# Patient Record
Sex: Female | Born: 1950 | Race: Black or African American | Hispanic: No
Health system: Southern US, Community
[De-identification: ages and names within clinical notes are randomized; demographics above are authoritative.]

## PROBLEM LIST (undated history)

## (undated) DIAGNOSIS — G931 Anoxic brain damage, not elsewhere classified: Secondary | ICD-10-CM

## (undated) DIAGNOSIS — J9621 Acute and chronic respiratory failure with hypoxia: Secondary | ICD-10-CM

## (undated) DIAGNOSIS — J449 Chronic obstructive pulmonary disease, unspecified: Secondary | ICD-10-CM

## (undated) DIAGNOSIS — I469 Cardiac arrest, cause unspecified: Secondary | ICD-10-CM

## (undated) DIAGNOSIS — J690 Pneumonitis due to inhalation of food and vomit: Secondary | ICD-10-CM

---

## 2018-07-30 ENCOUNTER — Ambulatory Visit (HOSPITAL_COMMUNITY)
Admission: AD | Admit: 2018-07-30 | Discharge: 2018-07-30 | Disposition: A | Discharge status: Home / Self Care | Payer: Medicare Other | Source: Other Acute Inpatient Hospital | Attending: Internal Medicine | Admitting: Internal Medicine

## 2018-07-30 ENCOUNTER — Inpatient Hospital Stay
Admission: RE | Admit: 2018-07-30 | Discharge: 2018-09-18 | Disposition: A | Discharge status: Home / Self Care | Payer: Medicare Other | Source: Ambulatory Visit | Attending: Internal Medicine | Admitting: Internal Medicine

## 2018-07-30 ENCOUNTER — Other Ambulatory Visit (HOSPITAL_COMMUNITY): Payer: Medicare Other

## 2018-07-30 DIAGNOSIS — D72829 Elevated white blood cell count, unspecified: Secondary | ICD-10-CM

## 2018-07-30 DIAGNOSIS — G931 Anoxic brain damage, not elsewhere classified: Secondary | ICD-10-CM | POA: Diagnosis present

## 2018-07-30 DIAGNOSIS — J690 Pneumonitis due to inhalation of food and vomit: Secondary | ICD-10-CM | POA: Diagnosis present

## 2018-07-30 DIAGNOSIS — Z931 Gastrostomy status: Secondary | ICD-10-CM

## 2018-07-30 DIAGNOSIS — J80 Acute respiratory distress syndrome: Secondary | ICD-10-CM

## 2018-07-30 DIAGNOSIS — R0603 Acute respiratory distress: Secondary | ICD-10-CM

## 2018-07-30 DIAGNOSIS — T85598A Other mechanical complication of other gastrointestinal prosthetic devices, implants and grafts, initial encounter: Secondary | ICD-10-CM

## 2018-07-30 DIAGNOSIS — T17908A Unspecified foreign body in respiratory tract, part unspecified causing other injury, initial encounter: Secondary | ICD-10-CM

## 2018-07-30 DIAGNOSIS — R609 Edema, unspecified: Secondary | ICD-10-CM

## 2018-07-30 DIAGNOSIS — Z4659 Encounter for fitting and adjustment of other gastrointestinal appliance and device: Secondary | ICD-10-CM

## 2018-07-30 DIAGNOSIS — J9690 Respiratory failure, unspecified, unspecified whether with hypoxia or hypercapnia: Secondary | ICD-10-CM | POA: Insufficient documentation

## 2018-07-30 DIAGNOSIS — J9621 Acute and chronic respiratory failure with hypoxia: Secondary | ICD-10-CM | POA: Diagnosis not present

## 2018-07-30 DIAGNOSIS — I469 Cardiac arrest, cause unspecified: Secondary | ICD-10-CM | POA: Diagnosis present

## 2018-07-30 DIAGNOSIS — J449 Chronic obstructive pulmonary disease, unspecified: Secondary | ICD-10-CM | POA: Diagnosis present

## 2018-07-30 HISTORY — DX: Pneumonitis due to inhalation of food and vomit: J69.0

## 2018-07-30 HISTORY — DX: Acute and chronic respiratory failure with hypoxia: J96.21

## 2018-07-30 HISTORY — DX: Anoxic brain damage, not elsewhere classified: G93.1

## 2018-07-30 HISTORY — DX: Cardiac arrest, cause unspecified: I46.9

## 2018-07-30 HISTORY — DX: Chronic obstructive pulmonary disease, unspecified: J44.9

## 2018-07-30 LAB — BLOOD GAS, ARTERIAL
Acid-base deficit: 0.8 mmol/L (ref 0.0–2.0)
Acid-base deficit: 1.8 mmol/L (ref 0.0–2.0)
Bicarbonate: 23.1 mmol/L (ref 20.0–28.0)
Bicarbonate: 23.7 mmol/L (ref 20.0–28.0)
Drawn by: 2308
FIO2: 40
FIO2: 40
MECHVT: 340 mL
MECHVT: 340 mL
O2 Saturation: 97.9 %
O2 Saturation: 82.6 %
PEEP: 5 cmH2O
PEEP: 5 cmH2O
Patient temperature: 98.6
Patient temperature: 98.6
RATE: 14 resp/min
RATE: 14 resp/min
pCO2 arterial: 36.6 mmHg (ref 32.0–48.0)
pCO2 arterial: 48.9 mmHg — ABNORMAL HIGH (ref 32.0–48.0)
pH, Arterial: 7.416 (ref 7.350–7.450)
pH, Arterial: 7.306 — ABNORMAL LOW (ref 7.350–7.450)
pO2, Arterial: 112 mmHg — ABNORMAL HIGH (ref 83.0–108.0)
pO2, Arterial: 55 mmHg — ABNORMAL LOW (ref 83.0–108.0)

## 2018-07-30 LAB — BASIC METABOLIC PANEL
Anion gap: 10 (ref 5–15)
BUN: 74 mg/dL — ABNORMAL HIGH (ref 8–23)
CO2: 21 mmol/L — ABNORMAL LOW (ref 22–32)
Calcium: 9.3 mg/dL (ref 8.9–10.3)
Chloride: 115 mmol/L — ABNORMAL HIGH (ref 98–111)
Creatinine, Ser: 1.71 mg/dL — ABNORMAL HIGH (ref 0.44–1.00)
GFR calc Af Amer: 36 mL/min — ABNORMAL LOW (ref >60)
GFR calc non Af Amer: 31 mL/min — ABNORMAL LOW (ref >60)
Glucose, Bld: 106 mg/dL — ABNORMAL HIGH (ref 70–99)
Potassium: 4.8 mmol/L (ref 3.5–5.1)
Sodium: 146 mmol/L — ABNORMAL HIGH (ref 135–145)

## 2018-07-30 LAB — URINALYSIS, ROUTINE W REFLEX MICROSCOPIC
Bilirubin Urine: NEGATIVE
Glucose, UA: NEGATIVE mg/dL
Ketones, ur: NEGATIVE mg/dL
Leukocytes,Ua: NEGATIVE
Nitrite: NEGATIVE
Protein, ur: 30 mg/dL — AB
Specific Gravity, Urine: 1.015 (ref 1.005–1.030)
pH: 5 (ref 5.0–8.0)

## 2018-07-30 LAB — CBC
HCT: 25.8 % — ABNORMAL LOW (ref 36.0–46.0)
Hemoglobin: 9.6 g/dL — ABNORMAL LOW (ref 12.0–15.0)
MCH: 31.1 pg (ref 26.0–34.0)
MCHC: 37.2 g/dL — ABNORMAL HIGH (ref 30.0–36.0)
MCV: 83.5 fL (ref 80.0–100.0)
Platelets: 152 10*3/uL (ref 150–400)
RBC: 3.09 MIL/uL — ABNORMAL LOW (ref 3.87–5.11)
RDW: 14.2 % (ref 11.5–15.5)
WBC: 11.1 10*3/uL — ABNORMAL HIGH (ref 4.0–10.5)
nRBC: 5.2 % — ABNORMAL HIGH (ref 0.0–0.2)

## 2018-07-30 LAB — C DIFFICILE QUICK SCREEN W PCR REFLEX
C Diff antigen: NEGATIVE
C Diff interpretation: NOT DETECTED
C Diff toxin: NEGATIVE

## 2018-07-30 MED ORDER — DAMOR DRESSING EX PADS
40.00 | MEDICATED_PAD | CUTANEOUS | Status: DC
Start: 2018-07-30 — End: 2018-07-30

## 2018-07-30 MED ORDER — SODIUM CHLORIDE 7 % IN NEBU
4.00 | INHALATION_SOLUTION | RESPIRATORY_TRACT | Status: DC
Start: 2018-07-30 — End: 2018-07-30

## 2018-07-30 MED ORDER — GENERIC EXTERNAL MEDICATION
Status: DC
Start: ? — End: 2018-07-30

## 2018-07-30 MED ORDER — ACETAMINOPHEN 160 MG/5ML PO LIQD
650.00 | ORAL | Status: DC
Start: ? — End: 2018-07-30

## 2018-07-30 MED ORDER — Medication
Status: DC
Start: ? — End: 2018-07-30

## 2018-07-30 MED ORDER — ACETAMINOPHEN 650 MG RE SUPP
650.00 | RECTAL | Status: DC
Start: ? — End: 2018-07-30

## 2018-07-30 MED ORDER — TUSSIDEX 10-30-200 MG/5ML PO LIQD
20.00 | ORAL | Status: DC
Start: 2018-07-30 — End: 2018-07-30

## 2018-07-30 MED ORDER — CARVEDILOL 12.5 MG PO TABS
12.50 | ORAL_TABLET | ORAL | Status: DC
Start: 2018-07-30 — End: 2018-07-30

## 2018-07-30 MED ORDER — OSCO ANTI-NAUSEA 1.87-1.87-21.5 OR SOLN
25.00 | ORAL | Status: DC
Start: 2018-07-30 — End: 2018-07-30

## 2018-07-30 MED ORDER — ONE-A-DAY WITHIN PO
30.00 | ORAL | Status: DC
Start: ? — End: 2018-07-30

## 2018-07-30 MED ORDER — GENERIC EXTERNAL MEDICATION
2.00 | Status: DC
Start: 2018-07-30 — End: 2018-07-30

## 2018-07-30 MED ORDER — ASPIRIN 81 MG PO CHEW
81.00 | CHEWABLE_TABLET | ORAL | Status: DC
Start: 2018-07-30 — End: 2018-07-30

## 2018-07-30 MED ORDER — ENOXAPARIN SODIUM 30 MG/0.3ML ~~LOC~~ SOLN
30.00 | SUBCUTANEOUS | Status: DC
Start: 2018-07-30 — End: 2018-07-30

## 2018-07-30 MED ORDER — GENERIC EXTERNAL MEDICATION
500.00 | Status: DC
Start: 2018-07-30 — End: 2018-07-30

## 2018-07-30 MED ORDER — Medication
0.08 | Status: DC
Start: ? — End: 2018-07-30

## 2018-07-30 MED ORDER — OXYCODONE HCL 5 MG PO TABS
10.00 | ORAL_TABLET | ORAL | Status: DC
Start: ? — End: 2018-07-30

## 2018-07-30 MED ORDER — ALBUTEROL SULFATE (2.5 MG/3ML) 0.083% IN NEBU
2.50 | INHALATION_SOLUTION | RESPIRATORY_TRACT | Status: DC
Start: 2018-07-30 — End: 2018-07-30

## 2018-07-30 MED ORDER — ACETAMINOPHEN 325 MG PO TABS
650.00 | ORAL_TABLET | ORAL | Status: DC
Start: ? — End: 2018-07-30

## 2018-07-30 MED ORDER — VICON FORTE PO CAPS
17.00 | ORAL_CAPSULE | ORAL | Status: DC
Start: ? — End: 2018-07-30

## 2018-07-30 MED ORDER — CHLOROPHYLL EX
20.00 | CUTANEOUS | Status: DC
Start: ? — End: 2018-07-30

## 2018-07-30 MED ORDER — CHLOROBUTANOL-EUGENOL & APAP
0.40 | Status: DC
Start: ? — End: 2018-07-30

## 2018-07-30 MED ORDER — CVS KIDPANT BOYS X-LARGE MISC
40.00 | Status: DC
Start: 2018-07-30 — End: 2018-07-30

## 2018-07-30 MED ORDER — DIAZEPAM 5 MG PO TABS
5.00 | ORAL_TABLET | ORAL | Status: DC
Start: ? — End: 2018-07-30

## 2018-07-30 MED ORDER — PHENYLEPHRINE-GUAIFENESIN 30-400 MG PO CP12
5.00 | ORAL_CAPSULE | ORAL | Status: DC
Start: 2018-07-30 — End: 2018-07-30

## 2018-07-30 MED ORDER — GENERIC EXTERNAL MEDICATION
.04 | Status: DC
Start: ? — End: 2018-07-30

## 2018-07-30 NOTE — Consult Note (Signed)
Pulmonary Critical Care Medicine [facility] [place]  PULMONARY SERVICE  Date of Service: 07/30/2018  PULMONARY CRITICAL CARE CONSULT   Catherine Pacheco [place]  LHT:[id number]  DOB: 16-Dec-1950   DOA: 07/30/2018  Referring Physician: Carron Curie, MD  HPI: Catherine Pacheco is a 69 y.o. female seen for follow up of Acute on Chronic Respiratory Failure.  Patient has multiple medical problems including COPD coronary artery disease chronic kidney disease CHF rheumatoid arthritis who was found by her daughter passed out.  Patient had apparently been having some shortness of breath prior.  She does have a history of COPD and positive history of tobacco use.  When found by EMS she was found to be asystolic.  Patient did get CPR about 10 minutes total and she had return of circulation.  Patient ultimately ended up being intubated chest x-ray had shown pulmonary edema.  There was also some concern about pneumonia.  It is likely that she may have aspirated.  Patient was started on antibiotics and she was noted that her temperature was 33C and so she was actually rewarmed.  EEG had shown diffuse encephalopathy and anoxic brain injury and there has been no improvement during her acute stay.  Ultimately she ended up having to have a tracheostomy for anticipated prolonged mechanical ventilation.  Review of Systems:  ROS performed and is unremarkable other than noted above.  Past Medical History:  Diagnosis Date  ?Marland Kitchen Anemia  ?Marland Kitchen Arthritis  RA - left hip  ?Marland Kitchen Coronary artery disease  [id number]  ?Marland Kitchen Hepatitis B 2013 oct  ?Marland Kitchen Hyperlipidemia  ?Marland Kitchen Hypertension  ?Marland Kitchen Ischemic cardiomyopathy 06/14/2016  ?. Kidney disease  stage 3 kidney disease  ?Marland Kitchen Lupus (*)  ?. Myocardial infarction (*)  ?Marland Kitchen Rheumatoid arthritis (*)  ?. Stented coronary artery 2013   Past Surgical History:  Procedure Laterality Date  ?Marland Kitchen Appendectomy  ?. Cardiac surgery  ?Marland Kitchen Tubal ligation   Allergies  Allergen Reactions  ?Marland Kitchen  Albuterol Hypertension  ?Marland Kitchen Indomethacin Other  Gi upset  ?Marland Kitchen Penicillins Unknown  ?Marland Kitchen Simvastatin Rash  ?. Meclizine Diarrhea    Family History: Non-Contributory to the present illness   Medications: Reviewed on Rounds  Physical Exam:  Vitals: Temperature 97.0 pulse 69 respiratory rate 31 blood pressure 177/84 saturations 98%  Ventilator Settings orally intubated mode ventilation assist control FiO2 50% tidal volume 364 PEEP 5  . General: Comfortable at this time . Eyes: Grossly normal lids, irises & conjunctiva . ENT: grossly tongue is normal . Neck: no obvious mass . Cardiovascular: S1-S2 normal no gallop or rub is noted . Respiratory: No rhonchi no rales . Abdomen: Soft nontender . Skin: no rash seen on limited exam . Musculoskeletal: not rigid . Psychiatric:unable to assess . Neurologic: no seizure no involuntary movements         Labs on Admission:  Basic Metabolic Panel: Recent Labs  Lab 07/30/18 0502  NA 146*  K 4.8  CL 115*  CO2 21*  GLUCOSE 106*  BUN 74*  CREATININE 1.71*  CALCIUM 9.3    Recent Labs  Lab 07/30/18 0505  PHART 7.416  PCO2ART 36.6  PO2ART 112*  HCO3 23.1  O2SAT 97.9    Liver Function Tests: No results for input(s): AST, ALT, ALKPHOS, BILITOT, PROT, ALBUMIN in the last 168 hours. No results for input(s): LIPASE, AMYLASE in the last 168 hours. No results for input(s): AMMONIA in the last 168 hours.  CBC: Recent Labs  Lab 07/30/18 0502  WBC 11.1*  HGB  9.6*  HCT 25.8*  MCV 83.5  PLT 152    Cardiac Enzymes: No results for input(s): CKTOTAL, CKMB, CKMBINDEX, TROPONINI in the last 168 hours.  BNP (last 3 results) No results for input(s): BNP in the last 8760 hours.  ProBNP (last 3 results) No results for input(s): PROBNP in the last 8760 hours.   Radiological Exams on Admission: Dg Chest Port 1 View  Result Date: 07/30/2018 CLINICAL DATA:  Check nasogastric catheter and endotracheal tube placement EXAM: PORTABLE  CHEST 1 VIEW COMPARISON:  None. FINDINGS: Cardiac shadow is mildly enlarged. Endotracheal tube and gastric catheter are noted in satisfactory position. Patchy infiltrate in the right base is noted. No bony abnormality is seen. IMPRESSION: Right basilar infiltrate. Tubes and lines as described. Electronically Signed   By: Alcide CleverMark  Lukens M.D.   On: 07/30/2018 02:51   Dg Abd Portable 1v  Result Date: 07/30/2018 CLINICAL DATA:  Check gastric catheter placement EXAM: PORTABLE ABDOMEN - 1 VIEW COMPARISON:  None. FINDINGS: Gastric catheter is noted within the stomach. No obstructive changes are seen. No bony abnormality is noted. IMPRESSION: Gastric catheter within the stomach. Electronically Signed   By: Alcide CleverMark  Lukens M.D.   On: 07/30/2018 02:52    Assessment/Plan Active Problems:   Acute on chronic respiratory failure with hypoxia (HCC)   Cardiac arrest (HCC)   COPD with chronic bronchitis (HCC)   Aspiration pneumonia due to gastric secretions (HCC)   Anoxic brain injury (HCC)   1. Acute on chronic respiratory failure with hypoxia she has multiple medical problems including COPD which likely led to her cardiac arrest as well as a history of coronary artery disease and CHF.  Patient right now remains unresponsive she has had a very poor EEG that was done at the other facility.  We will continue with full supportive care she remains endotracheally intubated.  Appears that she may also have some issues with dislodged teeth and we may have to do an oral tracheostomy 2. Cardiac arrest currently the rhythm is stable we will continue with supportive care monitor her closely overall prognosis is quite poor. 3. COPD by history nebulizers as necessary. 4. Right lower lobe pneumonia likely was aspiration patient has been treated at the other facility. 5. Anoxic brain injury pretty significant as deemed by the EEG at the other facility  I have personally seen and evaluated the patient, evaluated laboratory and  imaging results, formulated the assessment and plan and placed orders. The Patient requires high complexity decision making for assessment and support.  Case was discussed on Rounds with the Respiratory Therapy Staff Time Spent 70minutes  Yevonne PaxSaadat A Lizann Edelman, MD [facility] Pulmonary Critical Care Medicine Sleep Medicine

## 2018-07-31 DIAGNOSIS — I469 Cardiac arrest, cause unspecified: Secondary | ICD-10-CM | POA: Diagnosis not present

## 2018-07-31 DIAGNOSIS — J9621 Acute and chronic respiratory failure with hypoxia: Secondary | ICD-10-CM | POA: Diagnosis not present

## 2018-07-31 DIAGNOSIS — G931 Anoxic brain damage, not elsewhere classified: Secondary | ICD-10-CM | POA: Diagnosis not present

## 2018-07-31 DIAGNOSIS — J690 Pneumonitis due to inhalation of food and vomit: Secondary | ICD-10-CM | POA: Diagnosis not present

## 2018-07-31 LAB — MAGNESIUM: Magnesium: 2 mg/dL (ref 1.7–2.4)

## 2018-07-31 LAB — BLOOD GAS, ARTERIAL
Acid-base deficit: 1.9 mmol/L (ref 0.0–2.0)
Bicarbonate: 22.8 mmol/L (ref 20.0–28.0)
FIO2: 50
MECHVT: 340 mL
O2 Saturation: 98.8 %
PEEP: 5 cmH2O
Patient temperature: 98.6
RATE: 20 resp/min
pCO2 arterial: 42.2 mmHg (ref 32.0–48.0)
pH, Arterial: 7.352 (ref 7.350–7.450)
pO2, Arterial: 158 mmHg — ABNORMAL HIGH (ref 83.0–108.0)

## 2018-07-31 LAB — COMPREHENSIVE METABOLIC PANEL
ALT: 40 U/L (ref 0–44)
AST: 83 U/L — ABNORMAL HIGH (ref 15–41)
Albumin: 2.5 g/dL — ABNORMAL LOW (ref 3.5–5.0)
Alkaline Phosphatase: 93 U/L (ref 38–126)
Anion gap: 12 (ref 5–15)
BUN: 77 mg/dL — ABNORMAL HIGH (ref 8–23)
CO2: 21 mmol/L — ABNORMAL LOW (ref 22–32)
Calcium: 9.5 mg/dL (ref 8.9–10.3)
Chloride: 111 mmol/L (ref 98–111)
Creatinine, Ser: 1.64 mg/dL — ABNORMAL HIGH (ref 0.44–1.00)
GFR calc Af Amer: 37 mL/min — ABNORMAL LOW (ref >60)
GFR calc non Af Amer: 32 mL/min — ABNORMAL LOW (ref >60)
Glucose, Bld: 131 mg/dL — ABNORMAL HIGH (ref 70–99)
Potassium: 4.4 mmol/L (ref 3.5–5.1)
Sodium: 144 mmol/L (ref 135–145)
Total Bilirubin: 0.7 mg/dL (ref 0.3–1.2)
Total Protein: 3.2 g/dL — ABNORMAL LOW (ref 6.5–8.1)

## 2018-07-31 LAB — CBC
HCT: 22.4 % — ABNORMAL LOW (ref 36.0–46.0)
Hemoglobin: 8.3 g/dL — ABNORMAL LOW (ref 12.0–15.0)
MCH: 31.6 pg (ref 26.0–34.0)
MCHC: 37.1 g/dL — ABNORMAL HIGH (ref 30.0–36.0)
MCV: 85.2 fL (ref 80.0–100.0)
Platelets: 133 10*3/uL — ABNORMAL LOW (ref 150–400)
RBC: 2.63 MIL/uL — ABNORMAL LOW (ref 3.87–5.11)
RDW: 14.5 % (ref 11.5–15.5)
WBC: 12 10*3/uL — ABNORMAL HIGH (ref 4.0–10.5)
nRBC: 3 % — ABNORMAL HIGH (ref 0.0–0.2)

## 2018-07-31 LAB — HEMOGLOBIN A1C
Hgb A1c MFr Bld: 5.3 % (ref 4.8–5.6)
Mean Plasma Glucose: 105.41 mg/dL

## 2018-07-31 LAB — URINE CULTURE: Culture: NO GROWTH

## 2018-07-31 LAB — T4, FREE: Free T4: 0.67 ng/dL — ABNORMAL LOW (ref 0.82–1.77)

## 2018-07-31 LAB — PHOSPHORUS: Phosphorus: 4.6 mg/dL (ref 2.5–4.6)

## 2018-07-31 LAB — TSH: TSH: 1.494 u[IU]/mL (ref 0.350–4.500)

## 2018-07-31 NOTE — Progress Notes (Addendum)
Pulmonary Critical Care Medicine [facility] [place]   PULMONARY CRITICAL CARE SERVICE  PROGRESS NOTE  Date of Service: 07/31/2018  Catherine Pacheco  VPX:[id number]  DOB: 1950/07/11   DOA: 07/30/2018  Referring Physician: Carron Curie, MD  HPI: Catherine Pacheco is a 68 y.o. female seen for follow up of Acute on Chronic Respiratory Failure.  Patient remains on full support at this time with a rate of 12 current requiring 40% FiO2.  Remains intubated with ET tube 7.5, 23 at the lip.  Medications: Reviewed on Rounds  Physical Exam:  Vitals: Blood 65 respirations 20 BP 169/92 O2 sat 100% temp 99.0  Ventilator Settings ventilated AC VC rate of 12 tidal volume 340 PEEP of 5 FiO2 40%  . General: Comfortable at this time . Eyes: Grossly normal lids, irises & conjunctiva . ENT: grossly tongue is normal . Neck: no obvious mass . Cardiovascular: S1 S2 normal no gallop . Respiratory: No rales or rhonchi noted . Abdomen: soft . Skin: no rash seen on limited exam . Musculoskeletal: not rigid . Psychiatric:unable to assess . Neurologic: no seizure no involuntary movements         Lab Data:   Basic Metabolic Panel: Recent Labs  Lab 07/30/18 0502 07/31/18 0617  NA 146* 144  K 4.8 4.4  CL 115* 111  CO2 21* 21*  GLUCOSE 106* 131*  BUN 74* 77*  CREATININE 1.71* 1.64*  CALCIUM 9.3 9.5  MG  --  2.0  PHOS  --  4.6    ABG: Recent Labs  Lab 07/30/18 0505 07/30/18 2313 07/31/18 0500  PHART 7.416 7.306* 7.352  PCO2ART 36.6 48.9* 42.2  PO2ART 112* 55.0* 158*  HCO3 23.1 23.7 22.8  O2SAT 97.9 82.6 98.8    Liver Function Tests: Recent Labs  Lab 07/31/18 0617  AST 83*  ALT 40  ALKPHOS 93  BILITOT 0.7  PROT 3.2*  ALBUMIN 2.5*   No results for input(s): LIPASE, AMYLASE in the last 168 hours. No results for input(s): AMMONIA in the last 168 hours.  CBC: Recent Labs  Lab 07/30/18 0502 07/31/18 0617  WBC 11.1* 12.0*  HGB 9.6* 8.3*  HCT 25.8*  22.4*  MCV 83.5 85.2  PLT 152 133*    Cardiac Enzymes: No results for input(s): CKTOTAL, CKMB, CKMBINDEX, TROPONINI in the last 168 hours.  BNP (last 3 results) No results for input(s): BNP in the last 8760 hours.  ProBNP (last 3 results) No results for input(s): PROBNP in the last 8760 hours.  Radiological Exams: Dg Chest Port 1 View  Result Date: 07/30/2018 CLINICAL DATA:  Check nasogastric catheter and endotracheal tube placement EXAM: PORTABLE CHEST 1 VIEW COMPARISON:  None. FINDINGS: Cardiac shadow is mildly enlarged. Endotracheal tube and gastric catheter are noted in satisfactory position. Patchy infiltrate in the right base is noted. No bony abnormality is seen. IMPRESSION: Right basilar infiltrate. Tubes and lines as described. Electronically Signed   By: Alcide Clever M.D.   On: 07/30/2018 02:51   Dg Abd Portable 1v  Result Date: 07/30/2018 CLINICAL DATA:  Check gastric catheter placement EXAM: PORTABLE ABDOMEN - 1 VIEW COMPARISON:  None. FINDINGS: Gastric catheter is noted within the stomach. No obstructive changes are seen. No bony abnormality is noted. IMPRESSION: Gastric catheter within the stomach. Electronically Signed   By: Alcide Clever M.D.   On: 07/30/2018 02:52    Assessment/Plan Active Problems:   * No active hospital problems. *   1. Acute on chronic respiratory failure  with hypoxia patient remains intubated and unresponsive at this time.  Continue on ventilator full support as well as supportive measures of pulmonary toilet. 2. Cardiac arrest rhythm is stable prognosis is guarded 3. COPD per history, continue medications necessary. 4. Right lower lobe pneumonia likely from aspiration treated at previous facility team to monitor 5. Anoxic brain injury, significant per EEG at previous facility.   I have personally seen and evaluated the patient, evaluated laboratory and imaging results, formulated the assessment and plan and placed orders. The Patient requires  high complexity decision making for assessment and support.  Case was discussed on Rounds with the Respiratory Therapy Staff  Yevonne PaxSaadat A Cayla Wiegand, MD [facility] Pulmonary Critical Care Medicine Sleep Medicine

## 2018-08-01 DIAGNOSIS — J9621 Acute and chronic respiratory failure with hypoxia: Secondary | ICD-10-CM | POA: Diagnosis not present

## 2018-08-01 DIAGNOSIS — J690 Pneumonitis due to inhalation of food and vomit: Secondary | ICD-10-CM | POA: Diagnosis not present

## 2018-08-01 DIAGNOSIS — G931 Anoxic brain damage, not elsewhere classified: Secondary | ICD-10-CM | POA: Diagnosis not present

## 2018-08-01 DIAGNOSIS — I469 Cardiac arrest, cause unspecified: Secondary | ICD-10-CM | POA: Diagnosis not present

## 2018-08-02 ENCOUNTER — Encounter: Payer: Self-pay | Admitting: Internal Medicine

## 2018-08-02 DIAGNOSIS — J690 Pneumonitis due to inhalation of food and vomit: Secondary | ICD-10-CM | POA: Diagnosis not present

## 2018-08-02 DIAGNOSIS — G931 Anoxic brain damage, not elsewhere classified: Secondary | ICD-10-CM | POA: Diagnosis present

## 2018-08-02 DIAGNOSIS — I469 Cardiac arrest, cause unspecified: Secondary | ICD-10-CM | POA: Diagnosis not present

## 2018-08-02 DIAGNOSIS — J9621 Acute and chronic respiratory failure with hypoxia: Secondary | ICD-10-CM | POA: Diagnosis present

## 2018-08-02 DIAGNOSIS — J449 Chronic obstructive pulmonary disease, unspecified: Secondary | ICD-10-CM | POA: Diagnosis present

## 2018-08-02 LAB — CULTURE, RESPIRATORY W GRAM STAIN: Culture: NORMAL

## 2018-08-02 NOTE — Progress Notes (Signed)
Pulmonary Critical Care Medicine [facility] [place]   PULMONARY CRITICAL CARE SERVICE  PROGRESS NOTE  Date of Service: 08/02/2018  Catherine Pacheco  RCV:[id number]  DOB: March 23, 1950   DOA: 07/30/2018  Referring Physician: Carron Curie, MD  HPI: Catherine Pacheco is a 68 y.o. female seen for follow up of Acute on Chronic Respiratory Failure.  At this time patient is on the ventilator full support remains orally intubated.  Patient is on 28% FiO2  Medications: Reviewed on Rounds  Physical Exam:  Vitals: Temperature 98.1 pulse 80 respiratory rate 35 blood pressure 147/82 saturations 99%  Ventilator Settings mode ventilation assist control FiO2 20% tidal volume 431 PEEP 5  . General: Comfortable at this time . Eyes: Grossly normal lids, irises & conjunctiva . ENT: grossly tongue is normal . Neck: no obvious mass . Cardiovascular: S1 S2 normal no gallop . Respiratory: No rhonchi or rales are noted at this time . Abdomen: soft . Skin: no rash seen on limited exam . Musculoskeletal: not rigid . Psychiatric:unable to assess . Neurologic: no seizure no involuntary movements         Lab Data:   Basic Metabolic Panel: Recent Labs  Lab 07/30/18 0502 07/31/18 0617  NA 146* 144  K 4.8 4.4  CL 115* 111  CO2 21* 21*  GLUCOSE 106* 131*  BUN 74* 77*  CREATININE 1.71* 1.64*  CALCIUM 9.3 9.5  MG  --  2.0  PHOS  --  4.6    ABG: Recent Labs  Lab 07/30/18 0505 07/30/18 2313 07/31/18 0500  PHART 7.416 7.306* 7.352  PCO2ART 36.6 48.9* 42.2  PO2ART 112* 55.0* 158*  HCO3 23.1 23.7 22.8  O2SAT 97.9 82.6 98.8    Liver Function Tests: Recent Labs  Lab 07/31/18 0617  AST 83*  ALT 40  ALKPHOS 93  BILITOT 0.7  PROT 3.2*  ALBUMIN 2.5*   No results for input(s): LIPASE, AMYLASE in the last 168 hours. No results for input(s): AMMONIA in the last 168 hours.  CBC: Recent Labs  Lab 07/30/18 0502 07/31/18 0617  WBC 11.1* 12.0*  HGB 9.6* 8.3*  HCT  25.8* 22.4*  MCV 83.5 85.2  PLT 152 133*    Cardiac Enzymes: No results for input(s): CKTOTAL, CKMB, CKMBINDEX, TROPONINI in the last 168 hours.  BNP (last 3 results) No results for input(s): BNP in the last 8760 hours.  ProBNP (last 3 results) No results for input(s): PROBNP in the last 8760 hours.  Radiological Exams: No results found.  Assessment/Plan Active Problems:   Acute on chronic respiratory failure with hypoxia (HCC)   Cardiac arrest (HCC)   COPD with chronic bronchitis (HCC)   Aspiration pneumonia due to gastric secretions (HCC)   Anoxic brain injury (HCC)   1. Acute on chronic respiratory failure with hypoxia continue with full support on the ventilator RSB I mechanics have been poor not able to wean 2. Cardiac arrest currently rhythm has been stable we will continue to monitor 3. COPD at baseline continue present management 4. Aspiration pneumonia treated follow radiologically 5. Anoxic brain injury prognosis quite poor   I have personally seen and evaluated the patient, evaluated laboratory and imaging results, formulated the assessment and plan and placed orders. The Patient requires high complexity decision making for assessment and support.  Case was discussed on Rounds with the Respiratory Therapy Staff  Yevonne Pax, MD [facility] Pulmonary Critical Care Medicine Sleep Medicine

## 2018-08-03 DIAGNOSIS — J9621 Acute and chronic respiratory failure with hypoxia: Secondary | ICD-10-CM | POA: Diagnosis not present

## 2018-08-03 DIAGNOSIS — G931 Anoxic brain damage, not elsewhere classified: Secondary | ICD-10-CM | POA: Diagnosis not present

## 2018-08-03 DIAGNOSIS — I469 Cardiac arrest, cause unspecified: Secondary | ICD-10-CM | POA: Diagnosis not present

## 2018-08-03 DIAGNOSIS — J690 Pneumonitis due to inhalation of food and vomit: Secondary | ICD-10-CM | POA: Diagnosis not present

## 2018-08-03 NOTE — Progress Notes (Addendum)
Pulmonary Critical Care Medicine [facility] [place]   PULMONARY CRITICAL CARE SERVICE  PROGRESS NOTE  Date of Service: 08/01/2018  Catherine Pacheco  [id number]  DOB: 1950/06/20   DOA: 07/30/2018  Referring Physician: Carron Curie, MD  HPI: Catherine Pacheco is a 68 y.o. female seen for follow up of Acute on Chronic Respiratory Failure.  Patient continues on for support on the ventilator at this time.  Rate is now 16 with an FiO2 of 28%.  Medications: Reviewed on Rounds  Physical Exam:  Vitals: Pulse 76 respirations 20 BP 160/85 O2 sat 100% 10 974  Ventilator Settings vent mode AC VC rate of 16 tidal volume 40 PEEP 5 FiO2 of 28%.  . General: Comfortable at this time . Eyes: Grossly normal lids, irises & conjunctiva . ENT: grossly tongue is normal . Neck: no obvious mass . Cardiovascular: S1 S2 normal no gallop . Respiratory: No rales or rhonchi noted . Abdomen: soft . Skin: no rash seen on limited exam . Musculoskeletal: not rigid . Psychiatric:unable to assess . Neurologic: no seizure no involuntary movements         Lab Data:   Basic Metabolic Panel: Recent Labs  Lab 07/30/18 0502 07/31/18 0617  NA 146* 144  K 4.8 4.4  CL 115* 111  CO2 21* 21*  GLUCOSE 106* 131*  BUN 74* 77*  CREATININE 1.71* 1.64*  CALCIUM 9.3 9.5  MG  --  2.0  PHOS  --  4.6    ABG: Recent Labs  Lab 07/30/18 0505 07/30/18 2313 07/31/18 0500  PHART 7.416 7.306* 7.352  PCO2ART 36.6 48.9* 42.2  PO2ART 112* 55.0* 158*  HCO3 23.1 23.7 22.8  O2SAT 97.9 82.6 98.8    Liver Function Tests: Recent Labs  Lab 07/31/18 0617  AST 83*  ALT 40  ALKPHOS 93  BILITOT 0.7  PROT 3.2*  ALBUMIN 2.5*   No results for input(s): LIPASE, AMYLASE in the last 168 hours. No results for input(s): AMMONIA in the last 168 hours.  CBC: Recent Labs  Lab 07/30/18 0502 07/31/18 0617  WBC 11.1* 12.0*  HGB 9.6* 8.3*  HCT 25.8* 22.4*  MCV 83.5 85.2  PLT 152 133*     Cardiac Enzymes: No results for input(s): CKTOTAL, CKMB, CKMBINDEX, TROPONINI in the last 168 hours.  BNP (last 3 results) No results for input(s): BNP in the last 8760 hours.  ProBNP (last 3 results) No results for input(s): PROBNP in the last 8760 hours.  Radiological Exams: No results found.  Assessment/Plan Active Problems:   Acute on chronic respiratory failure with hypoxia (HCC)   Cardiac arrest (HCC)   COPD with chronic bronchitis (HCC)   Aspiration pneumonia due to gastric secretions (HCC)   Anoxic brain injury (HCC)   1. Acute on chronic respiratory failure with hypoxia continue with full support on the ventilator.  Patient's FiO2 has weaned slightly.  We will continue per protocol.  Aggressive pulmonary toilet and supportive measures as well. 2. Cardiac arrest currently rhythm has been stable we will continue to monitor 3. COPD at baseline continue present management 4. Aspiration pneumonia treated follow radiologically 5. Anoxic brain injury prognosis quite poor   I have personally seen and evaluated the patient, evaluated laboratory and imaging results, formulated the assessment and plan and placed orders. The Patient requires high complexity decision making for assessment and support.  Case was discussed on Rounds with the Respiratory Therapy Staff  Yevonne Pax, MD [facility] Pulmonary Critical Care Medicine Sleep  Medicine

## 2018-08-03 NOTE — Progress Notes (Addendum)
Pulmonary Critical Care Medicine [facility] [place]   PULMONARY CRITICAL CARE SERVICE  PROGRESS NOTE  Date of Service: 08/03/2018  Catherine Pacheco  [id number]:[id number]  DOB: [birth year]   DOA: 07/30/2018  Referring Physician: Carron Curie, MD  HPI: Catherine Pacheco is a 68 y.o. female seen for follow up of Acute on Chronic Respiratory Failure.  Patient has a 12-hour goal today_12/5 with an FiO2 of 20%.  Currently satting well with no distress or fever.  Medications: Reviewed on Rounds  Physical Exam:  Vitals: Pulse 81 respirations 28 BP 146/99 O2 sat 9 9% temp 97.9  Ventilator Settings pressure support 12/5 FiO2 of 28%  . General: Comfortable at this time . Eyes: Grossly normal lids, irises & conjunctiva . ENT: grossly tongue is normal . Neck: no obvious mass . Cardiovascular: S1 S2 normal no gallop . Respiratory: No rales or rhonchi noted . Abdomen: soft . Skin: no rash seen on limited exam . Musculoskeletal: not rigid . Psychiatric:unable to assess . Neurologic: no seizure no involuntary movements         Lab Data:   Basic Metabolic Panel: Recent Labs  Lab 07/30/18 0502 07/31/18 0617  NA 146* 144  K 4.8 4.4  CL 115* 111  CO2 21* 21*  GLUCOSE 106* 131*  BUN 74* 77*  CREATININE 1.71* 1.64*  CALCIUM 9.3 9.5  MG  --  2.0  PHOS  --  4.6    ABG: Recent Labs  Lab 07/30/18 0505 07/30/18 2313 07/31/18 0500  PHART 7.416 7.306* 7.352  PCO2ART 36.6 48.9* 42.2  PO2ART 112* 55.0* 158*  HCO3 23.1 23.7 22.8  O2SAT 97.9 82.6 98.8    Liver Function Tests: Recent Labs  Lab 07/31/18 0617  AST 83*  ALT 40  ALKPHOS 93  BILITOT 0.7  PROT 3.2*  ALBUMIN 2.5*   No results for input(s): LIPASE, AMYLASE in the last 168 hours. No results for input(s): AMMONIA in the last 168 hours.  CBC: Recent Labs  Lab 07/30/18 0502 07/31/18 0617  WBC 11.1* 12.0*  HGB 9.6* 8.3*  HCT 25.8* 22.4*  MCV 83.5 85.2  PLT 152 133*    Cardiac Enzymes: No  results for input(s): CKTOTAL, CKMB, CKMBINDEX, TROPONINI in the last 168 hours.  BNP (last 3 results) No results for input(s): BNP in the last 8760 hours.  ProBNP (last 3 results) No results for input(s): PROBNP in the last 8760 hours.  Radiological Exams: No results found.  Assessment/Plan Active Problems:   Acute on chronic respiratory failure with hypoxia (HCC)   Cardiac arrest (HCC)   COPD with chronic bronchitis (HCC)   Aspiration pneumonia due to gastric secretions (HCC)   Anoxic brain injury (HCC)   1. Acute on chronic respiratory failure with hypoxia continue with full support goal today on for support is 12 hours.  Continue supportive measures and pulmonary toilet. 2. Cardiac arrest currently rhythm has been stable we will continue to monitor 3. COPD at baseline continue present management 4. Aspiration pneumonia treated follow radiologically 5. Anoxic brain injury prognosis quite poor   I have personally seen and evaluated the patient, evaluated laboratory and imaging results, formulated the assessment and plan and placed orders. The Patient requires high complexity decision making for assessment and support.  Case was discussed on Rounds with the Respiratory Therapy Staff  Yevonne Pax, MD [facility] Pulmonary Critical Care Medicine Sleep Medicine

## 2018-08-04 ENCOUNTER — Other Ambulatory Visit (HOSPITAL_COMMUNITY): Payer: Medicare Other

## 2018-08-04 DIAGNOSIS — I469 Cardiac arrest, cause unspecified: Secondary | ICD-10-CM | POA: Diagnosis not present

## 2018-08-04 DIAGNOSIS — G931 Anoxic brain damage, not elsewhere classified: Secondary | ICD-10-CM | POA: Diagnosis not present

## 2018-08-04 DIAGNOSIS — J9621 Acute and chronic respiratory failure with hypoxia: Secondary | ICD-10-CM | POA: Diagnosis not present

## 2018-08-04 DIAGNOSIS — J690 Pneumonitis due to inhalation of food and vomit: Secondary | ICD-10-CM | POA: Diagnosis not present

## 2018-08-04 LAB — URINALYSIS, ROUTINE W REFLEX MICROSCOPIC
Bilirubin Urine: NEGATIVE
Glucose, UA: 50 mg/dL — AB
Ketones, ur: NEGATIVE mg/dL
Leukocytes,Ua: NEGATIVE
Nitrite: NEGATIVE
Protein, ur: 30 mg/dL — AB
Specific Gravity, Urine: 1.017 (ref 1.005–1.030)
pH: 5 (ref 5.0–8.0)

## 2018-08-04 LAB — CBC
HCT: 25.6 % — ABNORMAL LOW (ref 36.0–46.0)
Hemoglobin: 9.4 g/dL — ABNORMAL LOW (ref 12.0–15.0)
MCH: 31.1 pg (ref 26.0–34.0)
MCHC: 36.7 g/dL — ABNORMAL HIGH (ref 30.0–36.0)
MCV: 84.8 fL (ref 80.0–100.0)
Platelets: 206 10*3/uL (ref 150–400)
RBC: 3.02 MIL/uL — ABNORMAL LOW (ref 3.87–5.11)
RDW: 14.5 % (ref 11.5–15.5)
WBC: 24.4 10*3/uL — ABNORMAL HIGH (ref 4.0–10.5)
nRBC: 0.1 % (ref 0.0–0.2)

## 2018-08-04 LAB — BASIC METABOLIC PANEL
Anion gap: 14 (ref 5–15)
BUN: 80 mg/dL — ABNORMAL HIGH (ref 8–23)
CO2: 19 mmol/L — ABNORMAL LOW (ref 22–32)
Calcium: 9.5 mg/dL (ref 8.9–10.3)
Chloride: 111 mmol/L (ref 98–111)
Creatinine, Ser: 1.86 mg/dL — ABNORMAL HIGH (ref 0.44–1.00)
GFR calc Af Amer: 32 mL/min — ABNORMAL LOW (ref >60)
GFR calc non Af Amer: 28 mL/min — ABNORMAL LOW (ref >60)
Glucose, Bld: 145 mg/dL — ABNORMAL HIGH (ref 70–99)
Potassium: 4.9 mmol/L (ref 3.5–5.1)
Sodium: 144 mmol/L (ref 135–145)

## 2018-08-04 LAB — MAGNESIUM: Magnesium: 2 mg/dL (ref 1.7–2.4)

## 2018-08-04 LAB — LACTIC ACID, PLASMA: Lactic Acid, Venous: 1.5 mmol/L (ref 0.5–1.9)

## 2018-08-04 MED ORDER — Medication
Status: DC
Start: ? — End: 2018-08-04

## 2018-08-04 MED ORDER — GENERIC EXTERNAL MEDICATION
Status: DC
Start: ? — End: 2018-08-04

## 2018-08-04 NOTE — Progress Notes (Addendum)
[facility]ulmonary Critical Care [facility]   PULMONARY CRITICAL CARE SERVICE  PROGRESS NOTE  Date of Service: 08/04/2018  Catherine Pacheco  ZOX:[id number]  DOB: 1950/05/12   DOA: 07/30/2018  Referring Physician: Carron CurieAli Hijazi, MD  HPI: Catherine Pacheco is a 68 y.o. female seen for follow up of Acute on Chronic Respiratory Failure.  Patient remains on pressure support 12/5 FiO2 26% for 16-hour goal today.  ENT saw patient today for trach evaluation.  Medications: Reviewed on Rounds  Physical Exam:  Vitals: Pulse 70 respirations 27 BP 140/79 O2 sat 100% temp 95.8  Ventilator Settings pressure were 12/5 FiO2 of 28%  . General: Comfortable at this time . Eyes: Grossly normal lids, irises & conjunctiva . ENT: grossly tongue is normal . Neck: no obvious mass . Cardiovascular: S1 S2 normal no gallop . Respiratory: No rales or rhonchi noted . Abdomen: soft . Skin: no rash seen on limited exam . Musculoskeletal: not rigid . Psychiatric:unable to assess . Neurologic: no seizure no involuntary movements         Lab Data:   Basic Metabolic Panel: Recent Labs  Lab 07/30/18 0502 07/31/18 0617 08/04/18 0730  NA 146* 144 144  K 4.8 4.4 4.9  CL 115* 111 111  CO2 21* 21* 19*  GLUCOSE 106* 131* 145*  BUN 74* 77* 80*  CREATININE 1.71* 1.64* 1.86*  CALCIUM 9.3 9.5 9.5  MG  --  2.0 2.0  PHOS  --  4.6  --     ABG: Recent Labs  Lab 07/30/18 0505 07/30/18 2313 07/31/18 0500  PHART 7.416 7.306* 7.352  PCO2ART 36.6 48.9* 42.2  PO2ART 112* 55.0* 158*  HCO3 23.1 23.7 22.8  O2SAT 97.9 82.6 98.8    Liver Function Tests: Recent Labs  Lab 07/31/18 0617  AST 83*  ALT 40  ALKPHOS 93  BILITOT 0.7  PROT 3.2*  ALBUMIN 2.5*   No results for input(s): LIPASE, AMYLASE in the last 168 hours. No results for input(s): AMMONIA in the last 168 hours.  CBC: Recent Labs  Lab 07/30/18 0502 07/31/18 0617 08/04/18 0755  WBC 11.1* 12.0* 24.4*  HGB  9.6* 8.3* 9.4*  HCT 25.8* 22.4* 25.6*  MCV 83.5 85.2 84.8  PLT 152 133* 206    Cardiac Enzymes: No results for input(s): CKTOTAL, CKMB, CKMBINDEX, TROPONINI in the last 168 hours.  BNP (last 3 results) No results for input(s): BNP in the last 8760 hours.  ProBNP (last 3 results) No results for input(s): PROBNP in the last 8760 hours.  Radiological Exams: Dg Chest Port 1 View  Result Date: 08/04/2018 CLINICAL DATA:  Elevated [place] blood cell count. EXAM: PORTABLE CHEST 1 VIEW COMPARISON:  07/30/2018. FINDINGS: Endotracheal to tip 4.8 cm above the carina. NG tube tip below left hemidiaphragm. Stable cardiomegaly. No pulmonary venous congestion. Partial clearing of right base infiltrate. No pleural effusion or pneumothorax IMPRESSION: 1.  Lines and tubes in stable position. 2.  Stable cardiomegaly. 3.  Partial clearing of right base infiltrate. Electronically Signed   By: Maisie Fushomas  Register   On: 08/04/2018 14:06    Assessment/Plan Active Problems:   Acute on chronic respiratory failure with hypoxia (HCC)   Cardiac arrest (HCC)   COPD with chronic bronchitis (HCC)   Aspiration pneumonia due to gastric secretions (HCC)   Anoxic brain injury (HCC)   1. Acute on chronic respiratory failure with hypoxia continue with full support goal today on for support is 16 hours.  Continue supportive measures  and pulmonary toilet. 2. Cardiac arrest currently rhythm has been stable we will continue to monitor 3. COPD at baseline continue present management 4. Aspiration pneumonia treated follow radiologically 5. Anoxic brain injury prognosis quite poor   I have personally seen and evaluated the patient, evaluated laboratory and imaging results, formulated the assessment and plan and placed orders. The Patient requires high complexity decision making for assessment and support.  Case was discussed on Rounds with the Respiratory Therapy Staff  Yevonne Pax, MD [facility] Pulmonary Critical Care  Medicine Sleep Medicine

## 2018-08-05 ENCOUNTER — Encounter (HOSPITAL_BASED_OUTPATIENT_CLINIC_OR_DEPARTMENT_OTHER): Payer: Medicare Other

## 2018-08-05 ENCOUNTER — Encounter (HOSPITAL_COMMUNITY): Payer: Medicare Other

## 2018-08-05 DIAGNOSIS — I469 Cardiac arrest, cause unspecified: Secondary | ICD-10-CM | POA: Diagnosis not present

## 2018-08-05 DIAGNOSIS — J449 Chronic obstructive pulmonary disease, unspecified: Secondary | ICD-10-CM

## 2018-08-05 DIAGNOSIS — G931 Anoxic brain damage, not elsewhere classified: Secondary | ICD-10-CM | POA: Diagnosis not present

## 2018-08-05 DIAGNOSIS — J9621 Acute and chronic respiratory failure with hypoxia: Secondary | ICD-10-CM

## 2018-08-05 DIAGNOSIS — J690 Pneumonitis due to inhalation of food and vomit: Secondary | ICD-10-CM | POA: Diagnosis not present

## 2018-08-05 LAB — CBC
HCT: 25.8 % — ABNORMAL LOW (ref 36.0–46.0)
Hemoglobin: 9.5 g/dL — ABNORMAL LOW (ref 12.0–15.0)
MCH: 31.4 pg (ref 26.0–34.0)
MCHC: 36.8 g/dL — ABNORMAL HIGH (ref 30.0–36.0)
MCV: 85.1 fL (ref 80.0–100.0)
Platelets: 201 10*3/uL (ref 150–400)
RBC: 3.03 MIL/uL — ABNORMAL LOW (ref 3.87–5.11)
RDW: 14.6 % (ref 11.5–15.5)
WBC: 24.4 10*3/uL — ABNORMAL HIGH (ref 4.0–10.5)
nRBC: 0 % (ref 0.0–0.2)

## 2018-08-05 LAB — COMPREHENSIVE METABOLIC PANEL
ALT: 134 U/L — ABNORMAL HIGH (ref 0–44)
AST: 148 U/L — ABNORMAL HIGH (ref 15–41)
Albumin: 2.4 g/dL — ABNORMAL LOW (ref 3.5–5.0)
Alkaline Phosphatase: 118 U/L (ref 38–126)
Anion gap: 13 (ref 5–15)
BUN: 82 mg/dL — ABNORMAL HIGH (ref 8–23)
CO2: 20 mmol/L — ABNORMAL LOW (ref 22–32)
Calcium: 9.1 mg/dL (ref 8.9–10.3)
Chloride: 108 mmol/L (ref 98–111)
Creatinine, Ser: 1.76 mg/dL — ABNORMAL HIGH (ref 0.44–1.00)
GFR calc Af Amer: 34 mL/min — ABNORMAL LOW (ref >60)
GFR calc non Af Amer: 30 mL/min — ABNORMAL LOW (ref >60)
Glucose, Bld: 132 mg/dL — ABNORMAL HIGH (ref 70–99)
Potassium: 4.3 mmol/L (ref 3.5–5.1)
Sodium: 141 mmol/L (ref 135–145)
Total Bilirubin: 0.4 mg/dL (ref 0.3–1.2)
Total Protein: 5.4 g/dL — ABNORMAL LOW (ref 6.5–8.1)

## 2018-08-05 LAB — URINE CULTURE

## 2018-08-05 LAB — PROCALCITONIN: Procalcitonin: 0.12 ng/mL

## 2018-08-05 LAB — NOVEL CORONAVIRUS, NAA (HOSP ORDER, SEND-OUT TO REF LAB; TAT 18-24 HRS): SARS-CoV-2, NAA: NOT DETECTED

## 2018-08-05 LAB — C-REACTIVE PROTEIN: CRP: 0.9 mg/dL (ref <1.0)

## 2018-08-05 MED ORDER — GENERIC EXTERNAL MEDICATION
Status: DC
Start: ? — End: 2018-08-05

## 2018-08-05 MED ORDER — Medication
Status: DC
Start: ? — End: 2018-08-05

## 2018-08-05 NOTE — Progress Notes (Addendum)
Pulmonary Critical Care Medicine [facility]   PULMONARY CRITICAL CARE SERVICE  PROGRESS NOTE  Date of Service: 08/05/2018  Catherine Pacheco  [id number]  DOB: 01-20-1951   DOA: 07/30/2018  Referring Physician: Carron CurieAli Hijazi, MD  HPI: Catherine Pacheco is a 68 y.o. female seen for follow up of Acute on Chronic Respiratory Failure.  Has a 20-hour goal today on pressure support 12/5 with an FiO2 of 28%.  Satting well with no distress at this time.  Medications: Reviewed on Rounds  Physical Exam:  Vitals: Pulse 69 respirations 25 BP 135/63 O2 sat 99% temp 97.9  Ventilator Settings pressure support 12/5 FiO2 of 28%  . General: Comfortable at this time . Eyes: Grossly normal lids, irises & conjunctiva . ENT: grossly tongue is normal . Neck: no obvious mass . Cardiovascular: S1 S2 normal no gallop . Respiratory: No rales or rhonchi noted . Abdomen: soft . Skin: no rash seen on limited exam . Musculoskeletal: not rigid . Psychiatric:unable to assess . Neurologic: no seizure no involuntary movements         Lab Data:   Basic Metabolic Panel: Recent Labs  Lab 07/30/18 0502 07/31/18 0617 08/04/18 0730 08/05/18 0539  NA 146* 144 144 141  K 4.8 4.4 4.9 4.3  CL 115* 111 111 108  CO2 21* 21* 19* 20*  GLUCOSE 106* 131* 145* 132*  BUN 74* 77* 80* 82*  CREATININE 1.71* 1.64* 1.86* 1.76*  CALCIUM 9.3 9.5 9.5 9.1  MG  --  2.0 2.0  --   PHOS  --  4.6  --   --     ABG: Recent Labs  Lab 07/30/18 0505 07/30/18 2313 07/31/18 0500  PHART 7.416 7.306* 7.352  PCO2ART 36.6 48.9* 42.2  PO2ART 112* 55.0* 158*  HCO3 23.1 23.7 22.8  O2SAT 97.9 82.6 98.8    Liver Function Tests: Recent Labs  Lab 07/31/18 0617 08/05/18 0539  AST 83* 148*  ALT 40 134*  ALKPHOS 93 118  BILITOT 0.7 0.4  PROT 3.2* 5.4*  ALBUMIN 2.5* 2.4*   No results for input(s): LIPASE, AMYLASE in the last 168 hours. No results for input(s): AMMONIA in the last 168  hours.  CBC: Recent Labs  Lab 07/30/18 0502 07/31/18 0617 08/04/18 0755 08/05/18 0539  WBC 11.1* 12.0* 24.4* 24.4*  HGB 9.6* 8.3* 9.4* 9.5*  HCT 25.8* 22.4* 25.6* 25.8*  MCV 83.5 85.2 84.8 85.1  PLT 152 133* 206 201    Cardiac Enzymes: No results for input(s): CKTOTAL, CKMB, CKMBINDEX, TROPONINI in the last 168 hours.  BNP (last 3 results) No results for input(s): BNP in the last 8760 hours.  ProBNP (last 3 results) No results for input(s): PROBNP in the last 8760 hours.  Radiological Exams: Dg Chest Port 1 View  Result Date: 08/04/2018 CLINICAL DATA:  Elevated [place] blood cell count. EXAM: PORTABLE CHEST 1 VIEW COMPARISON:  07/30/2018. FINDINGS: Endotracheal to tip 4.8 cm above the carina. NG tube tip below left hemidiaphragm. Stable cardiomegaly. No pulmonary venous congestion. Partial clearing of right base infiltrate. No pleural effusion or pneumothorax IMPRESSION: 1.  Lines and tubes in stable position. 2.  Stable cardiomegaly. 3.  Partial clearing of right base infiltrate. Electronically Signed   By: Maisie Fushomas  Register   On: 08/04/2018 14:06   Vas Koreas Upper Extremity Venous Duplex  Result Date: 08/05/2018 UPPER VENOUS STUDY  Indications: Swelling Limitations: Poor ultrasound/tissue interface, line, bandages and ventilator, patient immobility, patient positioning. Performing Technologist: Chanda BusingGregory Collins RVT  Examination Guidelines: A complete evaluation includes B-mode imaging, spectral Doppler, color Doppler, and power Doppler as needed of all accessible portions of each vessel. Bilateral testing is considered an integral part of a complete examination. Limited examinations for reoccurring indications may be performed as noted.  Right Findings: +----------+------------+---------+-----------+----------+-------+ RIGHT     CompressiblePhasicitySpontaneousPropertiesSummary +----------+------------+---------+-----------+----------+-------+ IJV           Full       Yes        Yes                      +----------+------------+---------+-----------+----------+-------+ Subclavian    Full       Yes       Yes                      +----------+------------+---------+-----------+----------+-------+ Axillary      Full       Yes       Yes                      +----------+------------+---------+-----------+----------+-------+ Brachial      Full       Yes       Yes                      +----------+------------+---------+-----------+----------+-------+ Radial        Full                                          +----------+------------+---------+-----------+----------+-------+ Ulnar         Full                                          +----------+------------+---------+-----------+----------+-------+ Cephalic      Full                                          +----------+------------+---------+-----------+----------+-------+ Basilic       Full                                          +----------+------------+---------+-----------+----------+-------+  Left Findings: +----------+------------+---------+-----------+----------+-------+ LEFT      CompressiblePhasicitySpontaneousPropertiesSummary +----------+------------+---------+-----------+----------+-------+ IJV           Full       Yes       Yes                      +----------+------------+---------+-----------+----------+-------+ Subclavian    Full       Yes       Yes                      +----------+------------+---------+-----------+----------+-------+ Axillary      Full       Yes       Yes                      +----------+------------+---------+-----------+----------+-------+ Brachial      Full       Yes       Yes                      +----------+------------+---------+-----------+----------+-------+  Radial        Full                                          +----------+------------+---------+-----------+----------+-------+ Ulnar         Full                                           +----------+------------+---------+-----------+----------+-------+ Cephalic      Full                                          +----------+------------+---------+-----------+----------+-------+ Basilic       Full                                          +----------+------------+---------+-----------+----------+-------+  Summary:  Right: No evidence of deep vein thrombosis in the upper extremity. No evidence of superficial vein thrombosis in the upper extremity.  Left: No evidence of deep vein thrombosis in the upper extremity. No evidence of superficial vein thrombosis in the upper extremity.  *See table(s) above for measurements and observations.    Preliminary     Assessment/Plan Active Problems:   Acute on chronic respiratory failure with hypoxia (HCC)   Cardiac arrest (HCC)   COPD with chronic bronchitis (HCC)   Aspiration pneumonia due to gastric secretions (HCC)   Anoxic brain injury (HCC)   1. Acute on chronic respiratory failure with hypoxia continue with full supportgoal today on for support is 20 hours. Continue supportive measures and pulmonary toilet. 2. Cardiac arrest currently rhythm has been stable we will continue to monitor 3. COPD at baseline continue present management 4. Aspiration pneumonia treated follow radiologically 5. Anoxic brain injury prognosis quite poor   I have personally seen and evaluated the patient, evaluated laboratory and imaging results, formulated the assessment and plan and placed orders. The Patient requires high complexity decision making for assessment and support.  Case was discussed on Rounds with the Respiratory Therapy Staff  Yevonne Pax, MD [facility] Pulmonary Critical Care Medicine Sleep Medicine

## 2018-08-05 NOTE — Progress Notes (Signed)
Bilateral upper extremity venous duplex has been completed. Preliminary results can be found in CV Proc through chart review.   08/05/18 3:47 PM Olen Cordial RVT

## 2018-08-06 DIAGNOSIS — G931 Anoxic brain damage, not elsewhere classified: Secondary | ICD-10-CM | POA: Diagnosis not present

## 2018-08-06 DIAGNOSIS — I469 Cardiac arrest, cause unspecified: Secondary | ICD-10-CM | POA: Diagnosis not present

## 2018-08-06 DIAGNOSIS — J9621 Acute and chronic respiratory failure with hypoxia: Secondary | ICD-10-CM | POA: Diagnosis not present

## 2018-08-06 DIAGNOSIS — J690 Pneumonitis due to inhalation of food and vomit: Secondary | ICD-10-CM | POA: Diagnosis not present

## 2018-08-06 LAB — C DIFFICILE QUICK SCREEN W PCR REFLEX
C Diff antigen: NEGATIVE
C Diff interpretation: NOT DETECTED
C Diff toxin: NEGATIVE

## 2018-08-06 NOTE — Progress Notes (Addendum)
Pulmonary Critical [facility]   PULMONARY CRITICAL CARE SERVICE  PROGRESS NOTE  Date of Service: 08/06/2018  Catherine Pacheco  [id number]  DOB: [birth year]   DOA: 07/30/2018  Referring Physician: Carron CurieAli Hijazi, MD  HPI: Catherine Pacheco is a 68 y.o. female seen for follow up of Acute on Chronic Respiratory Failure.  Patient continues on pressure support 12/5 with an FiO2 of 28%.  Patient has a 24-hour goal at this time.  Satting well with no distress.  Plan is for trach tomorrow.  Medications: Reviewed on Rounds  Physical Exam:  Vitals: Pulse 73 respirations 21 BP 112/63 O2 sat 99% to 96.9  Ventilator Settings pressure support 12/5 FiO2 28%  . General: Comfortable at this time . Eyes: Grossly normal lids, irises & conjunctiva . ENT: grossly tongue is normal . Neck: no obvious mass . Cardiovascular: S1 S2 normal no gallop . Respiratory: No rales or rhonchi noted . Abdomen: soft . Skin: no rash seen on limited exam . Musculoskeletal: not rigid . Psychiatric:unable to assess . Neurologic: no seizure no involuntary movements         Lab Data:   Basic Metabolic Panel: Recent Labs  Lab 07/31/18 0617 08/04/18 0730 08/05/18 0539  NA 144 144 141  K 4.4 4.9 4.3  CL 111 111 108  CO2 21* 19* 20*  GLUCOSE 131* 145* 132*  BUN 77* 80* 82*  CREATININE 1.64* 1.86* 1.76*  CALCIUM 9.5 9.5 9.1  MG 2.0 2.0  --   PHOS 4.6  --   --     ABG: Recent Labs  Lab 07/30/18 2313 07/31/18 0500  PHART 7.306* 7.352  PCO2ART 48.9* 42.2  PO2ART 55.0* 158*  HCO3 23.7 22.8  O2SAT 82.6 98.8    Liver Function Tests: Recent Labs  Lab 07/31/18 0617 08/05/18 0539  AST 83* 148*  ALT 40 134*  ALKPHOS 93 118  BILITOT 0.7 0.4  PROT 3.2* 5.4*  ALBUMIN 2.5* 2.4*   No results for input(s): LIPASE, AMYLASE in the last 168 hours. No results for input(s): AMMONIA in the last 168 hours.  CBC: Recent Labs  Lab 07/31/18 0617 08/04/18 0755  08/05/18 0539  WBC 12.0* 24.4* 24.4*  HGB 8.3* 9.4* 9.5*  HCT 22.4* 25.6* 25.8*  MCV 85.2 84.8 85.1  PLT 133* 206 201    Cardiac Enzymes: No results for input(s): CKTOTAL, CKMB, CKMBINDEX, TROPONINI in the last 168 hours.  BNP (last 3 results) No results for input(s): BNP in the last 8760 hours.  ProBNP (last 3 results) No results for input(s): PROBNP in the last 8760 hours.  Radiological Exams: Vas Koreas Upper Extremity Venous Duplex  Result Date: 08/06/2018 UPPER VENOUS STUDY  Indications: Swelling Limitations: Poor ultrasound/tissue interface, line, bandages and ventilator, patient immobility, patient positioning. Performing Technologist: Chanda BusingGregory Collins RVT  Examination Guidelines: A complete evaluation includes B-mode imaging, spectral Doppler, color Doppler, and power Doppler as needed of all accessible portions of each vessel. Bilateral testing is considered an integral part of a complete examination. Limited examinations for reoccurring indications may be performed as noted.  Right Findings: +----------+------------+---------+-----------+----------+-------+ RIGHT     CompressiblePhasicitySpontaneousPropertiesSummary +----------+------------+---------+-----------+----------+-------+ IJV           Full       Yes       Yes                      +----------+------------+---------+-----------+----------+-------+ Subclavian    Full  Yes       Yes                      +----------+------------+---------+-----------+----------+-------+ Axillary      Full       Yes       Yes                      +----------+------------+---------+-----------+----------+-------+ Brachial      Full       Yes       Yes                      +----------+------------+---------+-----------+----------+-------+ Radial        Full                                          +----------+------------+---------+-----------+----------+-------+ Ulnar         Full                                           +----------+------------+---------+-----------+----------+-------+ Cephalic      Full                                          +----------+------------+---------+-----------+----------+-------+ Basilic       Full                                          +----------+------------+---------+-----------+----------+-------+  Left Findings: +----------+------------+---------+-----------+----------+-------+ LEFT      CompressiblePhasicitySpontaneousPropertiesSummary +----------+------------+---------+-----------+----------+-------+ IJV           Full       Yes       Yes                      +----------+------------+---------+-----------+----------+-------+ Subclavian    Full       Yes       Yes                      +----------+------------+---------+-----------+----------+-------+ Axillary      Full       Yes       Yes                      +----------+------------+---------+-----------+----------+-------+ Brachial      Full       Yes       Yes                      +----------+------------+---------+-----------+----------+-------+ Radial        Full                                          +----------+------------+---------+-----------+----------+-------+ Ulnar         Full                                          +----------+------------+---------+-----------+----------+-------+  Cephalic      Full                                          +----------+------------+---------+-----------+----------+-------+ Basilic       Full                                          +----------+------------+---------+-----------+----------+-------+  Summary:  Right: No evidence of deep vein thrombosis in the upper extremity. No evidence of superficial vein thrombosis in the upper extremity.  Left: No evidence of deep vein thrombosis in the upper extremity. No evidence of superficial vein thrombosis in the upper extremity.  *See table(s) above for  measurements and observations.  Diagnosing physician: Sherald Hess MD Electronically signed by Sherald Hess MD on 08/06/2018 at 5:25:45 PM.    Final     Assessment/Plan Active Problems:   Acute on chronic respiratory failure with hypoxia [facility])   Cardiac arrest (HCC)   COPD with chronic bronchitis (HCC)   Aspiration pneumonia due to gastric secretions (HCC)   Anoxic brain injury (HCC)   1. Acute on chronic respiratory failure with hypoxia continue pressure support for goal of 24 hours today currently on 28% FiO2. Continue supportive measures and pulmonary toilet. 2. Cardiac arrest currently rhythm has been stable we will continue to monitor 3. COPD at baseline continue present management 4. Aspiration pneumonia treated follow radiologically 5. Anoxic brain injury prognosis quite poor   I have personally seen and evaluated the patient, evaluated laboratory and imaging results, formulated the assessment and plan and placed orders. The Patient requires high complexity decision making for assessment and support.  Case was discussed on Rounds with the Respiratory Therapy Staff  Yevonne Pax, MD [facility] Pulmonary Critical Care Medicine Sleep Medicine

## 2018-08-07 ENCOUNTER — Inpatient Hospital Stay (HOSPITAL_COMMUNITY): Admission: RE | Admit: 2018-08-07 | Payer: Medicare Other | Source: Home / Self Care | Admitting: Otolaryngology

## 2018-08-07 ENCOUNTER — Encounter (HOSPITAL_COMMUNITY): Payer: Medicare Other | Admitting: Certified Registered Nurse Anesthetist

## 2018-08-07 ENCOUNTER — Encounter
Admission: RE | Disposition: A | Discharge status: Home / Self Care | Payer: Self-pay | Source: Ambulatory Visit | Attending: Internal Medicine

## 2018-08-07 DIAGNOSIS — J690 Pneumonitis due to inhalation of food and vomit: Secondary | ICD-10-CM | POA: Diagnosis not present

## 2018-08-07 DIAGNOSIS — G931 Anoxic brain damage, not elsewhere classified: Secondary | ICD-10-CM | POA: Diagnosis not present

## 2018-08-07 DIAGNOSIS — J9621 Acute and chronic respiratory failure with hypoxia: Secondary | ICD-10-CM | POA: Diagnosis not present

## 2018-08-07 DIAGNOSIS — I469 Cardiac arrest, cause unspecified: Secondary | ICD-10-CM | POA: Diagnosis not present

## 2018-08-07 HISTORY — PX: TRACHEOSTOMY TUBE PLACEMENT: SHX814

## 2018-08-07 LAB — CBC
HCT: 24.2 % — ABNORMAL LOW (ref 36.0–46.0)
Hemoglobin: 8.8 g/dL — ABNORMAL LOW (ref 12.0–15.0)
MCH: 31.1 pg (ref 26.0–34.0)
MCHC: 36.4 g/dL — ABNORMAL HIGH (ref 30.0–36.0)
MCV: 85.5 fL (ref 80.0–100.0)
Platelets: 205 10*3/uL (ref 150–400)
RBC: 2.83 MIL/uL — ABNORMAL LOW (ref 3.87–5.11)
RDW: 14.4 % (ref 11.5–15.5)
WBC: 15.5 10*3/uL — ABNORMAL HIGH (ref 4.0–10.5)
nRBC: 0.1 % (ref 0.0–0.2)

## 2018-08-07 LAB — CULTURE, RESPIRATORY W GRAM STAIN

## 2018-08-07 LAB — BASIC METABOLIC PANEL
Anion gap: 9 (ref 5–15)
BUN: 91 mg/dL — ABNORMAL HIGH (ref 8–23)
CO2: 24 mmol/L (ref 22–32)
Calcium: 8.8 mg/dL — ABNORMAL LOW (ref 8.9–10.3)
Chloride: 108 mmol/L (ref 98–111)
Creatinine, Ser: 2.42 mg/dL — ABNORMAL HIGH (ref 0.44–1.00)
GFR calc Af Amer: 23 mL/min — ABNORMAL LOW (ref >60)
GFR calc non Af Amer: 20 mL/min — ABNORMAL LOW (ref >60)
Glucose, Bld: 104 mg/dL — ABNORMAL HIGH (ref 70–99)
Potassium: 4.3 mmol/L (ref 3.5–5.1)
Sodium: 141 mmol/L (ref 135–145)

## 2018-08-07 LAB — MAGNESIUM: Magnesium: 2.1 mg/dL (ref 1.7–2.4)

## 2018-08-07 SURGERY — CREATION, TRACHEOSTOMY
Anesthesia: General | Site: Neck

## 2018-08-07 SURGERY — CREATION, TRACHEOSTOMY
Anesthesia: General

## 2018-08-07 MED ORDER — PHENYLEPHRINE 40 MCG/ML (10ML) SYRINGE FOR IV PUSH (FOR BLOOD PRESSURE SUPPORT)
PREFILLED_SYRINGE | INTRAVENOUS | Status: DC | PRN
  Administered 2018-08-07: 120 ug via INTRAVENOUS
  Administered 2018-08-07 (×2): 80 ug via INTRAVENOUS
  Administered 2018-08-07: 120 ug via INTRAVENOUS

## 2018-08-07 MED ORDER — LIDOCAINE-EPINEPHRINE 1 %-1:100000 IJ SOLN
INTRAMUSCULAR | Status: DC | PRN
  Administered 2018-08-07: 30 mL

## 2018-08-07 MED ORDER — LIDOCAINE-EPINEPHRINE 1 %-1:100000 IJ SOLN
INTRAMUSCULAR | Status: AC
  Filled 2018-08-07: qty 1

## 2018-08-07 MED ORDER — EPHEDRINE SULFATE-NACL 50-0.9 MG/10ML-% IV SOSY
PREFILLED_SYRINGE | INTRAVENOUS | Status: DC | PRN
  Administered 2018-08-07 (×3): 10 mg via INTRAVENOUS

## 2018-08-07 MED ORDER — SUGAMMADEX SODIUM 200 MG/2ML IV SOLN
INTRAVENOUS | Status: DC | PRN
  Administered 2018-08-07: 150 mg via INTRAVENOUS

## 2018-08-07 MED ORDER — 0.9 % SODIUM CHLORIDE (POUR BTL) OPTIME
TOPICAL | Status: DC | PRN
  Administered 2018-08-07: 1000 mL

## 2018-08-07 MED ORDER — LACTATED RINGERS IV SOLN
INTRAVENOUS | Status: DC | PRN
  Administered 2018-08-07: 13:00:00 via INTRAVENOUS

## 2018-08-07 MED ORDER — ROCURONIUM BROMIDE 10 MG/ML (PF) SYRINGE
PREFILLED_SYRINGE | INTRAVENOUS | Status: DC | PRN
  Administered 2018-08-07: 30 mg via INTRAVENOUS

## 2018-08-07 SURGICAL SUPPLY — 42 items
ATTRACTOMAT 16X20 MAGNETIC DRP (DRAPES) IMPLANT
BLADE SURG 15 STRL LF DISP TIS (BLADE) ×1 IMPLANT
BLADE SURG 15 STRL SS (BLADE) ×2
CLEANER TIP ELECTROSURG 2X2 (MISCELLANEOUS) ×3 IMPLANT
COVER SURGICAL LIGHT HANDLE (MISCELLANEOUS) ×3 IMPLANT
COVER WAND RF STERILE (DRAPES) ×3 IMPLANT
DRAPE HALF SHEET 40X57 (DRAPES) IMPLANT
DRESSING ALLEVYN 2X4 GNTL LT (GAUZE/BANDAGES/DRESSINGS) ×2 IMPLANT
ELECT COATED BLADE 2.86 ST (ELECTRODE) ×3 IMPLANT
ELECT REM PT RETURN 9FT ADLT (ELECTROSURGICAL) ×3
ELECTRODE REM PT RTRN 9FT ADLT (ELECTROSURGICAL) ×1 IMPLANT
GAUZE 4X4 16PLY RFD (DISPOSABLE) ×3 IMPLANT
GAUZE SPONGE 4X4 12PLY STRL (GAUZE/BANDAGES/DRESSINGS) ×2 IMPLANT
GEL ULTRASOUND 20GR AQUASONIC (MISCELLANEOUS) ×3 IMPLANT
GLOVE SS BIOGEL STRL SZ 7.5 (GLOVE) ×1 IMPLANT
GLOVE SUPERSENSE BIOGEL SZ 7.5 (GLOVE) ×2
GOWN STRL REUS W/ TWL LRG LVL3 (GOWN DISPOSABLE) ×1 IMPLANT
GOWN STRL REUS W/ TWL XL LVL3 (GOWN DISPOSABLE) ×1 IMPLANT
GOWN STRL REUS W/TWL LRG LVL3 (GOWN DISPOSABLE) ×2
GOWN STRL REUS W/TWL XL LVL3 (GOWN DISPOSABLE) ×2
HOLDER TRACH TUBE VELCRO 19.5 (MISCELLANEOUS) ×3 IMPLANT
KIT BASIN OR (CUSTOM PROCEDURE TRAY) ×3 IMPLANT
KIT SUCTION CATH 14FR (SUCTIONS) ×3 IMPLANT
KIT TURNOVER KIT B (KITS) ×3 IMPLANT
NDL HYPO 25GX1X1/2 BEV (NEEDLE) ×1 IMPLANT
NEEDLE HYPO 25GX1X1/2 BEV (NEEDLE) ×3 IMPLANT
NS IRRIG 1000ML POUR BTL (IV SOLUTION) ×3 IMPLANT
PACK EENT II TURBAN DRAPE (CUSTOM PROCEDURE TRAY) ×3 IMPLANT
PAD ARMBOARD 7.5X6 YLW CONV (MISCELLANEOUS) IMPLANT
PENCIL BUTTON HOLSTER BLD 10FT (ELECTRODE) ×3 IMPLANT
SPONGE DRAIN TRACH 4X4 STRL 2S (GAUZE/BANDAGES/DRESSINGS) ×3 IMPLANT
SPONGE INTESTINAL PEANUT (DISPOSABLE) ×3 IMPLANT
SUT SILK 2 0 SH CR/8 (SUTURE) ×3 IMPLANT
SUT SILK 3 0 TIES 10X30 (SUTURE) IMPLANT
SYR 5ML LUER SLIP (SYRINGE) ×3 IMPLANT
SYR CONTROL 10ML LL (SYRINGE) ×3 IMPLANT
TOWEL OR 17X24 6PK STRL BLUE (TOWEL DISPOSABLE) ×3 IMPLANT
TOWEL OR 17X26 10 PK STRL BLUE (TOWEL DISPOSABLE) ×3 IMPLANT
TUBE CONNECTING 12'X1/4 (SUCTIONS) ×1
TUBE CONNECTING 12X1/4 (SUCTIONS) ×2 IMPLANT
TUBE TRACH SHILEY  6 DIST  CUF (TUBING) ×4 IMPLANT
TUBE TRACH SHILEY 8 DIST CUF (TUBING) IMPLANT

## 2018-08-07 NOTE — Anesthesia Preprocedure Evaluation (Signed)
Anesthesia Evaluation  Patient identified by MRN, date of birth, ID band Patient awake    Reviewed: Allergy & Precautions, NPO status , Patient's Chart, lab work & pertinent test results  Airway Mallampati: II  TM Distance: >3 FB Neck ROM: Full    Dental no notable dental hx.    Pulmonary COPD,    Pulmonary exam normal breath sounds clear to auscultation       Cardiovascular + CAD and + Past MI  Normal cardiovascular exam Rhythm:Regular Rate:Normal     Neuro/Psych negative neurological ROS  negative psych ROS   GI/Hepatic negative GI ROS, Neg liver ROS,   Endo/Other  negative endocrine ROS  Renal/GU negative Renal ROS  negative genitourinary   Musculoskeletal negative musculoskeletal ROS (+)   Abdominal   Peds negative pediatric ROS (+)  Hematology negative hematology ROS (+)   Anesthesia Other Findings Respiratory Failure  Reproductive/Obstetrics negative OB ROS                             Anesthesia Physical Anesthesia Plan  ASA: IV  Anesthesia Plan: General   Post-op Pain Management:    Induction: Intravenous  PONV Risk Score and Plan: 3 and Ondansetron, Dexamethasone, Midazolam and Treatment may vary due to age or medical condition  Airway Management Planned: Oral ETT  Additional Equipment:   Intra-op Plan:   Post-operative Plan: Post-operative intubation/ventilation  Informed Consent: I have reviewed the patients History and Physical, chart, labs and discussed the procedure including the risks, benefits and alternatives for the proposed anesthesia with the patient or authorized representative who has indicated his/her understanding and acceptance.       Plan Discussed with: CRNA  Anesthesia Plan Comments:         Anesthesia Quick Evaluation

## 2018-08-07 NOTE — OR Nursing (Signed)
Dr. Ezzard Standing removed one loose tooth from patient at the end of the procedure.

## 2018-08-07 NOTE — Op Note (Deleted)
  The note originally documented on this encounter has been moved the the encounter in which it belongs.  

## 2018-08-07 NOTE — Anesthesia Postprocedure Evaluation (Signed)
Anesthesia Post Note  Patient: Catherine Pacheco  Procedure(s) Performed: TRACHEOSTOMY (N/A Neck)     Patient location during evaluation: ICU Anesthesia Type: General Level of consciousness: patient remains intubated per anesthesia plan Pain management: pain level controlled Vital Signs Assessment: post-procedure vital signs reviewed and stable Respiratory status: patient on ventilator - see flowsheet for VS Cardiovascular status: blood pressure returned to baseline and stable Postop Assessment: no apparent nausea or vomiting Anesthetic complications: no    Last Vitals: There were no vitals filed for this visit.  Last Pain: There were no vitals filed for this visit.               Lowella Curb

## 2018-08-07 NOTE — Interval H&P Note (Signed)
History and Physical Interval Note:  08/07/2018 7:43 AM  Catherine Pacheco  has presented today for surgery, with the diagnosis of RESP FAILURE.  The various methods of treatment have been discussed with the patient and family. After consideration of risks, benefits and other options for treatment, the patient has consented to  Procedure(s): TRACHEOSTOMY (N/A) as a surgical intervention.  The patient's history has been reviewed, patient examined, no change in status, stable for surgery.  I have reviewed the patient's chart and labs.  Questions were answered to the patient's satisfaction.     Dillard Cannon

## 2018-08-07 NOTE — Progress Notes (Addendum)
Pulmonary Critical Care Medicine [facility] [place]   PULMONARY CRITICAL CARE SERVICE  PROGRESS NOTE  Date of Service: 08/07/2018  Jonia Turowski  KZL:[id number]  DOB: Sep 18, 1950   DOA: 07/30/2018  Referring Physician: Carron Curie, MD  HPI: Giselda Goldbach is a 68 y.o. female seen for follow up of Acute on Chronic Respiratory Failure.  Patient was trached today with a #6 cuffed Shiley.  Currently on full support on the ventilator at this time requiring 28% FiO2.  Medications: Reviewed on Rounds  Physical Exam:  Vitals: Pulse 67 respirations 27 BP 110/65 O2 sat 99% temp 97.4  Ventilator Settings ventilator mode AC VC rate of 12 tidal volume 400 PEEP of 5 FiO2 28%  . General: Comfortable at this time . Eyes: Grossly normal lids, irises & conjunctiva . ENT: grossly tongue is normal . Neck: no obvious mass . Cardiovascular: S1 S2 normal no gallop . Respiratory: No rales or rhonchi noted . Abdomen: soft . Skin: no rash seen on limited exam . Musculoskeletal: not rigid . Psychiatric:unable to assess . Neurologic: no seizure no involuntary movements         Lab Data:   Basic Metabolic Panel: Recent Labs  Lab 08/04/18 0730 08/05/18 0539 08/07/18 0536  NA 144 141 141  K 4.9 4.3 4.3  CL 111 108 108  CO2 19* 20* 24  GLUCOSE 145* 132* 104*  BUN 80* 82* 91*  CREATININE 1.86* 1.76* 2.42*  CALCIUM 9.5 9.1 8.8*  MG 2.0  --  2.1    ABG: No results for input(s): PHART, PCO2ART, PO2ART, HCO3, O2SAT in the last 168 hours.  Liver Function Tests: Recent Labs  Lab 08/05/18 0539  AST 148*  ALT 134*  ALKPHOS 118  BILITOT 0.4  PROT 5.4*  ALBUMIN 2.4*   No results for input(s): LIPASE, AMYLASE in the last 168 hours. No results for input(s): AMMONIA in the last 168 hours.  CBC: Recent Labs  Lab 08/04/18 0755 08/05/18 0539 08/07/18 0536  WBC 24.4* 24.4* 15.5*  HGB 9.4* 9.5* 8.8*  HCT 25.6* 25.8* 24.2*  MCV 84.8 85.1 85.5  PLT 206 201 205     Cardiac Enzymes: No results for input(s): CKTOTAL, CKMB, CKMBINDEX, TROPONINI in the last 168 hours.  BNP (last 3 results) No results for input(s): BNP in the last 8760 hours.  ProBNP (last 3 results) No results for input(s): PROBNP in the last 8760 hours.  Radiological Exams: No results found.  Assessment/Plan Active Problems:   Acute on chronic respiratory failure with hypoxia (HCC)   Cardiac arrest (HCC)   COPD with chronic bronchitis (HCC)   Aspiration pneumonia due to gastric secretions (HCC)   Anoxic brain injury (HCC)   1. Acute on chronic respiratory with hypoxia patient was trached today with a #6 cuffed Shiley.  Continue on full support and address weaning again tomorrow.  Continue supportive measures and pulmonary toilet 2. Cardiac arrest rhythm is stable continue to monitor 3. COPD at baseline continue present management 4. Aspiration pneumonia treated continue to follow radiologically 5. Anoxic brain injury prognosis is poor   I have personally seen and evaluated the patient, evaluated laboratory and imaging results, formulated the assessment and plan and placed orders. The Patient requires high complexity decision making for assessment and support.  Case was discussed on Rounds with the Respiratory Therapy Staff  Yevonne Pax, MD [facility] Pulmonary Critical Care Medicine Sleep Medicine

## 2018-08-07 NOTE — Brief Op Note (Signed)
08/07/2018  5:20 PM  PATIENT:  Catherine Pacheco  68 y.o. female  PRE-OPERATIVE DIAGNOSIS:  RESP FAILURE  POST-OPERATIVE DIAGNOSIS:  RESP FAILURE  PROCEDURE:  Procedure(s): TRACHEOSTOMY (N/A)  # 6 Shiley Extactraction of loose right lower canine   SURGEON:  Surgeon(s) and Role:    Ezzard Standing, Kristine Garbe, MD - Primary  PHYSICIAN ASSISTANT:   ASSISTANTS: none   ANESTHESIA:   general  EBL:  50 mL   BLOOD ADMINISTERED:none  DRAINS: none   LOCAL MEDICATIONS USED:  XYLOCAINE   SPECIMEN:  No Specimen  DISPOSITION OF SPECIMEN:  N/A  COUNTS:  YES  TOURNIQUET:  * No tourniquets in log *  DICTATION: .Other Dictation: Dictation Number [id number]  PLAN OF CARE: Discharge to home after PACU  PATIENT DISPOSITION:  PACU - hemodynamically stable.   Delay start of Pharmacological VTE agent (>24hrs) due to surgical blood loss or risk of bleeding: yes

## 2018-08-07 NOTE — H&P (Signed)
PREOPERATIVE H&P  Chief Complaint: Acute on chronic respiratory failure  HPI: Catherine Pacheco is a 68 y.o. female who presents for evaluation of tracheostomy because of chronic respiratory failure.  Patient was initially admitted to [facility] on 07/23/2018 as she was found nonresponsive by her daughter.  EMS performed CPR and patient was transferred to the hospital and intubated.  Chest x-ray showed findings consistent with possible aspiration pneumonia.  EEG demonstrated changes consistent with anoxic brain injury. Patient is a heavy smoker with history of COPD, CAD, hypertension, lupus, rheumatoid arthritis and hepatitis B. Patient was subsequently transferred to [facility] on 07/30/2018 intubated and on vent.  Tracheostomy was recommended.  Past Medical History:  Diagnosis Date  . Acute on chronic respiratory failure with hypoxia (HCC)   . Anoxic brain injury (HCC)   . Aspiration pneumonia due to gastric secretions (HCC)   . Cardiac arrest (HCC)   . COPD with chronic bronchitis (HCC)     Social History   Socioeconomic History  . Marital status: Widowed    Spouse name: Not on file  . Number of children: Not on file  . Years of education: Not on file  . Highest education level: Not on file  Occupational History  . Not on file  Social Needs  . Financial resource strain: Not on file  . Food insecurity:    Worry: Not on file    Inability: Not on file  . Transportation needs:    Medical: Not on file    Non-medical: Not on file  Tobacco Use  . Smoking status: Not on file  Substance and Sexual Activity  . Alcohol use: Not on file  . Drug use: Not on file  . Sexual activity: Not on file  Lifestyle  . Physical activity:    Days per week: Not on file    Minutes per session: Not on file  . Stress: Not on file  Relationships  . Social connections:    Talks on phone: Not on file    Gets together: Not on file    Attends religious service: Not on file    Active member of club or organization: Not on file    Attends meetings of clubs or organizations: Not on file    Relationship status: Not on file  Other Topics Concern  . Not on file  Social History Narrative  . Not on file   No family history on file. Allergies  Allergen Reactions  . Albuterol Hypertension  . Penicillins     UNSPECIFIED REACTION   . Indomethacin Nausea And Vomiting    Gi upset  . Meclizine Diarrhea  . Simvastatin Rash   Prior to Admission medications   Not on File     Positive ROS: Negative  All other systems have been reviewed and were otherwise negative with the exception of those mentioned in the HPI and as above.  Physical Exam: There were no vitals filed for this visit.  General: Patient is nonresponsive.  Intubated and on vent. Nasal: Clear nasal passages Neck: No palpable adenopathy or thyroid nodules.  Trachea palpably midline. Cardiovascular: Regular rate and rhythm, no murmur.  Respiratory: Clear to auscultation Neurologic: Patient is nonresponsive.   Assessment/Plan: RESP FAILURE Plan for Procedure(s): TRACHEOSTOMY   Dillard Cannon, MD 08/07/2018 7:37 AM

## 2018-08-07 NOTE — Transfer of Care (Signed)
Immediate Anesthesia Transfer of Care Note  Patient: Catherine Pacheco  Procedure(s) Performed: TRACHEOSTOMY (N/A Neck)  Patient Location: Nursing Unit  Anesthesia Type:General  Level of Consciousness: awake  Airway & Oxygen Therapy: Patient Spontanous Breathing and Patient placed on Ventilator (see vital sign flow sheet for setting)  Post-op Assessment: Report given to RN and Post -op Vital signs reviewed and stable  Post vital signs: Reviewed and stable  Last Vitals:  Vitals Value Taken Time  BP    Temp    Pulse    Resp    SpO2      Last Pain: There were no vitals filed for this visit.       Complications: No apparent anesthesia complications

## 2018-08-07 NOTE — Op Note (Signed)
NAME: Catherine Pacheco, Catherine Pacheco MEDICAL RECORD TS:[id number] ACCOUNT [id number] DATE OF BIRTH:14-Oct-1950 FACILITY: [facility] LOCATION: [facility] PHYSICIAN:CHRISTOPHER Braxton Feathers, MD  OPERATIVE REPORT  DATE OF PROCEDURE:  08/07/2018  PREOPERATIVE DIAGNOSIS:  Acute on chronic respiratory failure.  POSTOPERATIVE DIAGNOSIS:  Acute on chronic respiratory failure.  OPERATION PERFORMED:  Tracheostomy with a #6 Shiley tracheostomy tube.  Dental extraction of a lower right canine tooth that was extremely loose.  SURGEON:  Dillard Cannon, MD  ANESTHESIA:  General endotracheal.  ESTIMATED BLOOD LOSS:  Minimal.  COMPLICATIONS:  None.  BRIEF CLINICAL NOTE:  The patient is a 68 year old female who apparently suffered a cardiac arrest while at home.  She was resuscitated by EMS with CPR.  She was subsequently admitted to an outside hospital and intubated.  On EEG, she had suffered anoxic  brain injury with anoxic encephalopathy.  She was subsequently transferred to [facility] a little over a week ago and intubated on a vent.  Pulmonary recommended the patient undergoing tracheostomy as she has a very poor prognosis with  poor weaning and extubation potential.  She was taken to the operating room at this time for tracheostomy.  She also has a couple of very loose teeth that may extracted during the procedure.  DESCRIPTION OF PROCEDURE:  The patient was brought straight down from [facility] on the vent.  Her neck was extended, and the neck was prepped with Betadine solution and draped in sterile towels.  The proposed incision site was marked out  and injected with 4-5 mL of Xylocaine with epinephrine for hemostasis and local anesthetic.  A vertical incision was made just below the cricoid cartilage midline.  Dissection was carried down through subcutaneous tissue.  Strap muscles were divided in  the midline and retracted laterally.  The cricoid cartilage and the first 3  tracheal rings were exposed after dividing the thyroid isthmus.  A horizontal tracheotomy was performed between the first and second tracheal rings with an inferior-based flap.   A #6 Shiley cuffed tube was inserted without any difficulty after removing the endotracheal tube.  The patient had minimal bleeding.  The trach was secured with 2-0 silk sutures x4 and a Velcro trach collar around the neck.  The mouth was examined and  the patient had a very loose right lower canine that was just about falling out.  This was removed with minimal bleeding.  This completed the procedure.  The patient was subsequently transferred back to [facility].  LN/NUANCE  D:08/07/2018 T:08/07/2018 JOB:[id number]

## 2018-08-08 DIAGNOSIS — J690 Pneumonitis due to inhalation of food and vomit: Secondary | ICD-10-CM | POA: Diagnosis not present

## 2018-08-08 DIAGNOSIS — I469 Cardiac arrest, cause unspecified: Secondary | ICD-10-CM | POA: Diagnosis not present

## 2018-08-08 DIAGNOSIS — J9621 Acute and chronic respiratory failure with hypoxia: Secondary | ICD-10-CM | POA: Diagnosis not present

## 2018-08-08 DIAGNOSIS — G931 Anoxic brain damage, not elsewhere classified: Secondary | ICD-10-CM | POA: Diagnosis not present

## 2018-08-08 LAB — URINALYSIS, ROUTINE W REFLEX MICROSCOPIC
Bilirubin Urine: NEGATIVE
Glucose, UA: NEGATIVE mg/dL
Ketones, ur: NEGATIVE mg/dL
Leukocytes,Ua: NEGATIVE
Nitrite: NEGATIVE
Protein, ur: 100 mg/dL — AB
Specific Gravity, Urine: 1.015 (ref 1.005–1.030)
pH: 6 (ref 5.0–8.0)

## 2018-08-08 LAB — BASIC METABOLIC PANEL
Anion gap: 10 (ref 5–15)
BUN: 79 mg/dL — ABNORMAL HIGH (ref 8–23)
CO2: 23 mmol/L (ref 22–32)
Calcium: 8.6 mg/dL — ABNORMAL LOW (ref 8.9–10.3)
Chloride: 111 mmol/L (ref 98–111)
Creatinine, Ser: 2.33 mg/dL — ABNORMAL HIGH (ref 0.44–1.00)
GFR calc Af Amer: 24 mL/min — ABNORMAL LOW (ref >60)
GFR calc non Af Amer: 21 mL/min — ABNORMAL LOW (ref >60)
Glucose, Bld: 111 mg/dL — ABNORMAL HIGH (ref 70–99)
Potassium: 4 mmol/L (ref 3.5–5.1)
Sodium: 144 mmol/L (ref 135–145)

## 2018-08-08 LAB — VANCOMYCIN, RANDOM: Vancomycin Rm: 21

## 2018-08-08 NOTE — Progress Notes (Addendum)
Pulmonary Critical Care Medicine [place]   PULMONARY CRITICAL CARE SERVICE  PROGRESS NOTE  Date of Service: 08/08/2018  Catherine Pacheco  [id number]  DOB: 11/22/1950   DOA: 07/30/2018  Referring Physician: Merton Border, MD  HPI: Catherine Pacheco is a 68 y.o. female seen for follow up of Acute on Chronic Respiratory Failure.  Patient remains stable since having trach placed yesterday was able to do 2 hours and 20 minutes on pressure support 12/5 with an FiO2 of 28% today.  Continue weaning per protocol.  Medications: Reviewed on Rounds  Physical Exam:  Vitals: Pulse 74 respirations 23 BP 122/62 O2 sat 100% temp 98.7  Ventilator Settings AC VC rate of 12 tidal volume 400 PEEP of 5 FiO2 of 28%  . General: Comfortable at this time . Eyes: Grossly normal lids, irises & conjunctiva . ENT: grossly tongue is normal . Neck: no obvious mass . Cardiovascular: S1 S2 normal no gallop . Respiratory: No rales or rhonchi noted . Abdomen: soft . Skin: no rash seen on limited exam . Musculoskeletal: not rigid . Psychiatric:unable to assess . Neurologic: no seizure no involuntary movements         Lab Data:   Basic Metabolic Panel: Recent Labs  Lab 08/04/18 0730 08/05/18 0539 08/07/18 0536 08/08/18 0449  NA 144 141 141 144  K 4.9 4.3 4.3 4.0  CL 111 108 108 111  CO2 19* 20* 24 23  GLUCOSE 145* 132* 104* 111*  BUN 80* 82* 91* 79*  CREATININE 1.86* 1.76* 2.42* 2.33*  CALCIUM 9.5 9.1 8.8* 8.6*  MG 2.0  --  2.1  --     ABG: No results for input(s): PHART, PCO2ART, PO2ART, HCO3, O2SAT in the last 168 hours.  Liver Function Tests: Recent Labs  Lab 08/05/18 0539  AST 148*  ALT 134*  ALKPHOS 118  BILITOT 0.4  PROT 5.4*  ALBUMIN 2.4*   No results for input(s): LIPASE, AMYLASE in the last 168 hours. No results for input(s): AMMONIA in the last 168 hours.  CBC: Recent Labs  Lab 08/04/18 0755 08/05/18 0539 08/07/18 0536  WBC 24.4*  24.4* 15.5*  HGB 9.4* 9.5* 8.8*  HCT 25.6* 25.8* 24.2*  MCV 84.8 85.1 85.5  PLT 206 201 205    Cardiac Enzymes: No results for input(s): CKTOTAL, CKMB, CKMBINDEX, TROPONINI in the last 168 hours.  BNP (last 3 results) No results for input(s): BNP in the last 8760 hours.  ProBNP (last 3 results) No results for input(s): PROBNP in the last 8760 hours.  Radiological Exams: No results found.  Assessment/Plan Active Problems:   Acute on chronic respiratory failure with hypoxia (HCC)   Cardiac arrest (HCC)   COPD with chronic bronchitis (HCC)   Aspiration pneumonia due to gastric secretions (HCC)   Anoxic brain injury ([place])   1. Acute on chronic respiratory with hypoxia patient was trached yesterday, patient was able to tolerate 240 minutes on pressure support today and now is resting on full support again. Continue supportive measures and pulmonary toilet 2. Cardiac arrest rhythm is stable continue to monitor 3. COPD at baseline continue present management 4. Aspiration pneumonia treated continue to follow radiologically 5. Anoxic brain injury prognosis is poor   I have personally seen and evaluated the patient, evaluated laboratory and imaging results, formulated the assessment and plan and placed orders. The Patient requires high complexity decision making for assessment and support.  Case was discussed on Rounds with the Respiratory Therapy Staff  Allyne Gee, MD [facility] Pulmonary Critical Care Medicine Sleep Medicine

## 2018-08-09 DIAGNOSIS — G931 Anoxic brain damage, not elsewhere classified: Secondary | ICD-10-CM | POA: Diagnosis not present

## 2018-08-09 DIAGNOSIS — I469 Cardiac arrest, cause unspecified: Secondary | ICD-10-CM | POA: Diagnosis not present

## 2018-08-09 DIAGNOSIS — J9621 Acute and chronic respiratory failure with hypoxia: Secondary | ICD-10-CM | POA: Diagnosis not present

## 2018-08-09 DIAGNOSIS — J690 Pneumonitis due to inhalation of food and vomit: Secondary | ICD-10-CM | POA: Diagnosis not present

## 2018-08-09 LAB — URINE CULTURE: Culture: >=100000 — AB

## 2018-08-09 LAB — CULTURE, BLOOD (ROUTINE X 2)
Culture: NO GROWTH
Culture: NO GROWTH
Special Requests: ADEQUATE

## 2018-08-09 LAB — BASIC METABOLIC PANEL WITH GFR
Anion gap: 9 (ref 5–15)
BUN: 65 mg/dL — ABNORMAL HIGH (ref 8–23)
CO2: 23 mmol/L (ref 22–32)
Calcium: 8.3 mg/dL — ABNORMAL LOW (ref 8.9–10.3)
Chloride: 106 mmol/L (ref 98–111)
Creatinine, Ser: 2.04 mg/dL — ABNORMAL HIGH (ref 0.44–1.00)
GFR calc Af Amer: 29 mL/min — ABNORMAL LOW
GFR calc non Af Amer: 25 mL/min — ABNORMAL LOW
Glucose, Bld: 126 mg/dL — ABNORMAL HIGH (ref 70–99)
Potassium: 3.5 mmol/L (ref 3.5–5.1)
Sodium: 138 mmol/L (ref 135–145)

## 2018-08-09 NOTE — Progress Notes (Addendum)
Pulmonary Critical Care Medicine [place]   PULMONARY CRITICAL CARE SERVICE  PROGRESS NOTE  Date of Service: 08/09/2018  Catherine Pacheco  [id number]  DOB: Jul 31, 1950   DOA: 07/30/2018  Referring Physician: Merton Border, MD  HPI: Catherine Pacheco is a 68 y.o. female seen for follow up of Acute on Chronic Respiratory Failure.  Patient has a 4-hour goal today on pressure support 12/5 with an FiO2 of 28%.  Satting well no distress currently.  Medications: Reviewed on Rounds  Physical Exam:  Vitals: Pulse 66 respirations 25 BP 122/63 O2 sat 100% temp 97.3   Ventilator Settings pressure support 12/5 FiO2 28%  . General: Comfortable at this time . Eyes: Grossly normal lids, irises & conjunctiva . ENT: grossly tongue is normal . Neck: no obvious mass . Cardiovascular: S1 S2 normal no gallop . Respiratory: No rales or rhonchi noted . Abdomen: soft . Skin: no rash seen on limited exam . Musculoskeletal: not rigid . Psychiatric:unable to assess . Neurologic: no seizure no involuntary movements         Lab Data:   Basic Metabolic Panel: Recent Labs  Lab 08/04/18 0730 08/05/18 0539 08/07/18 0536 08/08/18 0449 08/09/18 0749  NA 144 141 141 144 138  K 4.9 4.3 4.3 4.0 3.5  CL 111 108 108 111 106  CO2 19* 20* 24 23 23   GLUCOSE 145* 132* 104* 111* 126*  BUN 80* 82* 91* 79* 65*  CREATININE 1.86* 1.76* 2.42* 2.33* 2.04*  CALCIUM 9.5 9.1 8.8* 8.6* 8.3*  MG 2.0  --  2.1  --   --     ABG: No results for input(s): PHART, PCO2ART, PO2ART, HCO3, O2SAT in the last 168 hours.  Liver Function Tests: Recent Labs  Lab 08/05/18 0539  AST 148*  ALT 134*  ALKPHOS 118  BILITOT 0.4  PROT 5.4*  ALBUMIN 2.4*   No results for input(s): LIPASE, AMYLASE in the last 168 hours. No results for input(s): AMMONIA in the last 168 hours.  CBC: Recent Labs  Lab 08/04/18 0755 08/05/18 0539 08/07/18 0536  WBC 24.4* 24.4* 15.5*  HGB 9.4* 9.5* 8.8*   HCT 25.6* 25.8* 24.2*  MCV 84.8 85.1 85.5  PLT 206 201 205    Cardiac Enzymes: No results for input(s): CKTOTAL, CKMB, CKMBINDEX, TROPONINI in the last 168 hours.  BNP (last 3 results) No results for input(s): BNP in the last 8760 hours.  ProBNP (last 3 results) No results for input(s): PROBNP in the last 8760 hours.  Radiological Exams: No results found.  Assessment/Plan Active Problems:   Acute on chronic respiratory failure with hypoxia (HCC)   Cardiac arrest (HCC)   COPD with chronic bronchitis (HCC)   Aspiration pneumonia due to gastric secretions (HCC)   Anoxic brain injury ([place])   1. Acute on chronic respiratory with hypoxia patient was trached two days ago,  patient has a goal of 4 hours on pressure support 12/5 FiO2 28% today.  Currently doing well with this.  Continue supportive meds and pulmonary toilet. 2. Cardiac arrest rhythm is stable continue to monitor 3. COPD at baseline continue present management 4. Aspiration pneumonia treated continue to follow radiologically 5. Anoxic brain injury prognosis is poor   I have personally seen and evaluated the patient, evaluated laboratory and imaging results, formulated the assessment and plan and placed orders. The Patient requires high complexity decision making for assessment and support.  Case was discussed on Rounds with the Respiratory Therapy Staff  [facility]  Richardson Dopp, MD [facility] Pulmonary Critical Care Medicine Sleep Medicine

## 2018-08-10 ENCOUNTER — Other Ambulatory Visit (HOSPITAL_COMMUNITY): Payer: Medicare Other

## 2018-08-10 ENCOUNTER — Encounter (HOSPITAL_COMMUNITY): Payer: Self-pay | Admitting: Otolaryngology

## 2018-08-10 DIAGNOSIS — J9621 Acute and chronic respiratory failure with hypoxia: Secondary | ICD-10-CM | POA: Diagnosis not present

## 2018-08-10 DIAGNOSIS — I469 Cardiac arrest, cause unspecified: Secondary | ICD-10-CM | POA: Diagnosis not present

## 2018-08-10 DIAGNOSIS — G931 Anoxic brain damage, not elsewhere classified: Secondary | ICD-10-CM | POA: Diagnosis not present

## 2018-08-10 DIAGNOSIS — J690 Pneumonitis due to inhalation of food and vomit: Secondary | ICD-10-CM | POA: Diagnosis not present

## 2018-08-10 LAB — CBC
HCT: 21.1 % — ABNORMAL LOW (ref 36.0–46.0)
Hemoglobin: 7.7 g/dL — ABNORMAL LOW (ref 12.0–15.0)
MCH: 30.9 pg (ref 26.0–34.0)
MCHC: 36.5 g/dL — ABNORMAL HIGH (ref 30.0–36.0)
MCV: 84.7 fL (ref 80.0–100.0)
Platelets: 200 10*3/uL (ref 150–400)
RBC: 2.49 MIL/uL — ABNORMAL LOW (ref 3.87–5.11)
RDW: 14.4 % (ref 11.5–15.5)
WBC: 12.4 10*3/uL — ABNORMAL HIGH (ref 4.0–10.5)
nRBC: 0 % (ref 0.0–0.2)

## 2018-08-10 LAB — BASIC METABOLIC PANEL
Anion gap: 9 (ref 5–15)
BUN: 58 mg/dL — ABNORMAL HIGH (ref 8–23)
CO2: 23 mmol/L (ref 22–32)
Calcium: 8.2 mg/dL — ABNORMAL LOW (ref 8.9–10.3)
Chloride: 102 mmol/L (ref 98–111)
Creatinine, Ser: 1.89 mg/dL — ABNORMAL HIGH (ref 0.44–1.00)
GFR calc Af Amer: 31 mL/min — ABNORMAL LOW (ref >60)
GFR calc non Af Amer: 27 mL/min — ABNORMAL LOW (ref >60)
Glucose, Bld: 114 mg/dL — ABNORMAL HIGH (ref 70–99)
Potassium: 3.8 mmol/L (ref 3.5–5.1)
Sodium: 134 mmol/L — ABNORMAL LOW (ref 135–145)

## 2018-08-10 LAB — MAGNESIUM: Magnesium: 1.7 mg/dL (ref 1.7–2.4)

## 2018-08-10 MED ORDER — Medication
Status: DC
Start: ? — End: 2018-08-10

## 2018-08-10 NOTE — Progress Notes (Signed)
Pulmonary Critical Care Medicine [place]   PULMONARY CRITICAL CARE SERVICE  PROGRESS NOTE  Date of Service: 08/10/2018  Catherine Pacheco  TKW:[id number]  DOB: [birth year]   DOA: 07/30/2018  Referring Physician: Merton Border, MD  HPI: Catherine Pacheco is a 68 y.o. female seen for follow up of Acute on Chronic Respiratory Failure.  Patient right now is on pressure support mode has been on 28% FiO2 good tidal volume is noted goal is for about 12 hours  Medications: Reviewed on Rounds  Physical Exam:  Vitals: Temperature 97.7 pulse 70 respiratory 25 blood pressure 127/75 saturations 100%  Ventilator Settings mode ventilation pressure support FiO2 28% tidal volume 442 pressure poor 12 PEEP 5  . General: Comfortable at this time . Eyes: Grossly normal lids, irises & conjunctiva . ENT: grossly tongue is normal . Neck: no obvious mass . Cardiovascular: S1 S2 normal no gallop . Respiratory: No rhonchi or rales are noted at this time . Abdomen: soft . Skin: no rash seen on limited exam . Musculoskeletal: not rigid . Psychiatric:unable to assess . Neurologic: no seizure no involuntary movements         Lab Data:   Basic Metabolic Panel: Recent Labs  Lab 08/04/18 0730 08/05/18 0539 08/07/18 0536 08/08/18 0449 08/09/18 0749 08/10/18 0717  NA 144 141 141 144 138 134*  K 4.9 4.3 4.3 4.0 3.5 3.8  CL 111 108 108 111 106 102  CO2 19* 20* 24 23 23 23   GLUCOSE 145* 132* 104* 111* 126* 114*  BUN 80* 82* 91* 79* 65* 58*  CREATININE 1.86* 1.76* 2.42* 2.33* 2.04* 1.89*  CALCIUM 9.5 9.1 8.8* 8.6* 8.3* 8.2*  MG 2.0  --  2.1  --   --  1.7    ABG: No results for input(s): PHART, PCO2ART, PO2ART, HCO3, O2SAT in the last 168 hours.  Liver Function Tests: Recent Labs  Lab 08/05/18 0539  AST 148*  ALT 134*  ALKPHOS 118  BILITOT 0.4  PROT 5.4*  ALBUMIN 2.4*   No results for input(s): LIPASE, AMYLASE in the last 168 hours. No results for  input(s): AMMONIA in the last 168 hours.  CBC: Recent Labs  Lab 08/04/18 0755 08/05/18 0539 08/07/18 0536 08/10/18 0717  WBC 24.4* 24.4* 15.5* 12.4*  HGB 9.4* 9.5* 8.8* 7.7*  HCT 25.6* 25.8* 24.2* 21.1*  MCV 84.8 85.1 85.5 84.7  PLT 206 201 205 200    Cardiac Enzymes: No results for input(s): CKTOTAL, CKMB, CKMBINDEX, TROPONINI in the last 168 hours.  BNP (last 3 results) No results for input(s): BNP in the last 8760 hours.  ProBNP (last 3 results) No results for input(s): PROBNP in the last 8760 hours.  Radiological Exams: No results found.  Assessment/Plan Active Problems:   Acute on chronic respiratory failure with hypoxia (HCC)   Cardiac arrest (HCC)   COPD with chronic bronchitis (HCC)   Aspiration pneumonia due to gastric secretions (HCC)   Anoxic brain injury ([place])   1. Acute on chronic respiratory failure with hypoxia we will continue with full support on pressure support the goal is for 12 hours today 2. Cardiac arrest rhythm stable 3. COPD with bronchitis patient is at baseline 4. Aspiration pneumonia treated 5. Anoxic brain njury grossly unchanged   I have personally seen and evaluated the patient, evaluated laboratory and imaging results, formulated the assessment and plan and placed orders. The Patient requires high complexity decision making for assessment and support.  Case was discussed on  Rounds with the Respiratory Therapy Staff  Allyne Gee, MD [facility] Pulmonary Critical Care Medicine Sleep Medicine

## 2018-08-11 DIAGNOSIS — J690 Pneumonitis due to inhalation of food and vomit: Secondary | ICD-10-CM | POA: Diagnosis not present

## 2018-08-11 DIAGNOSIS — J9621 Acute and chronic respiratory failure with hypoxia: Secondary | ICD-10-CM | POA: Diagnosis not present

## 2018-08-11 DIAGNOSIS — G931 Anoxic brain damage, not elsewhere classified: Secondary | ICD-10-CM | POA: Diagnosis not present

## 2018-08-11 DIAGNOSIS — I469 Cardiac arrest, cause unspecified: Secondary | ICD-10-CM | POA: Diagnosis not present

## 2018-08-11 LAB — SARS CORONAVIRUS 2: SARS Coronavirus 2: NOT DETECTED

## 2018-08-11 NOTE — Consult Note (Signed)
Chief Complaint: Patient was seen in consultation today for percutaneous gastric tube placement at the request of Dr Mariah Milling   Supervising Physician: Daryll Brod  Patient Status: Select IP  History of Present Illness: Catherine Pacheco is a 68 y.o. female   COPD; smoker; Hep B  Found down-- 20 min CPR Aspiration PNA Anoxic encephalopathy New UTI-- started tx 6/8  Dysphagia Malnutrition Deconditioning Need for long term care  Request for percutaneous gastric tube placement Approved per imaging in IR  Plan for possible 6/11-- need 3 days of Tx for UTI to be complete Afeb  Past Medical History:  Diagnosis Date   Acute on chronic respiratory failure with hypoxia (HCC)    Anoxic brain injury ([place])    Aspiration pneumonia due to gastric secretions (HCC)    Cardiac arrest ([place])    COPD with chronic bronchitis ([place])     Allergies: Albuterol; Penicillins; Indomethacin; Meclizine; and Simvastatin  Medications: Prior to Admission medications   Not on File     No family history on file.  Social History   Socioeconomic History   Marital status: Widowed    Spouse name: Not on file   Number of children: Not on file   Years of education: Not on file   Highest education level: Not on file  Occupational History   Not on file  Social Needs   Financial resource strain: Not on file   Food insecurity:    Worry: Not on file    Inability: Not on file   Transportation needs:    Medical: Not on file    Non-medical: Not on file  Tobacco Use   Smoking status: Not on file  Substance and Sexual Activity   Alcohol use: Not on file   Drug use: Not on file   Sexual activity: Not on file  Lifestyle   Physical activity:    Days per week: Not on file    Minutes per session: Not on file   Stress: Not on file  Relationships   Social connections:    Talks on phone: Not on file    Gets together: Not on file    Attends religious service: Not  on file    Active member of club or organization: Not on file    Attends meetings of clubs or organizations: Not on file    Relationship status: Not on file  Other Topics Concern   Not on file  Social History Narrative   Not on file    Review of Systems: A 12 point ROS discussed and pertinent positives are indicated in the HPI above.  All other systems are negative.   Vital Signs: There were no vitals taken for this visit.  Physical Exam Vitals signs reviewed.  Constitutional:      Comments: No response  Cardiovascular:     Rate and Rhythm: Regular rhythm.     Heart sounds: Normal heart sounds.  Pulmonary:     Comments: Trach/vent Abdominal:     Palpations: Abdomen is soft.  Skin:    General: Skin is warm.  Neurological:     Comments: No response  Psychiatric:     Comments: Consented with Husband Harvey by phone     Imaging: Ct Abdomen Wo Contrast  Result Date: 08/10/2018 CLINICAL DATA:  Preop planning for gastrostomy tube EXAM: CT ABDOMEN WITHOUT CONTRAST TECHNIQUE: Multidetector CT imaging of the abdomen was performed following the standard protocol without IV contrast. COMPARISON:  None. FINDINGS: Lower chest:  Scattered coronary calcifications. No pleural or pericardial effusion. Subpleural bleb in the inferior right middle lobe. Hepatobiliary: Unremarkable liver. Hyperdense material in the dependent aspect of the nondistended gallbladder fundus may represent mass, small gallstones, or vicarious excretion of contrast. No biliary ductal dilatation. Pancreas: Unremarkable. No pancreatic ductal dilatation or surrounding inflammatory changes. Spleen: Normal in size without focal abnormality. Adrenals/Urinary Tract: Adrenal glands are unremarkable. Renal arterial calcifications. Kidneys are normal, without renal calculi, focal lesion, or hydronephrosis. Stomach/Bowel: Nasogastric tube decompresses the stomach. The does appear to be appropriate percutaneous window for gastrostomy  placement. Visualized small bowel and colon are nondilated. Scattered colonic diverticula. Vascular/Lymphatic: Heavy aortoiliac arterial calcifications without aneurysm. No abdominal or mesenteric adenopathy localized. Other: No ascites. No free air. Musculoskeletal: Small umbilical hernia containing only mesenteric fat. Prominent Schmorl's node in the superior endplate of L2. Advanced spondylitic changes L3-S1. No acute fracture or worrisome bone lesion. IMPRESSION: 1. Satisfactory percutaneous window for gastrostomy placement. 2. Coronary and aortoiliac arterial calcifications. 3. Colonic diverticulosis. Electronically Signed   By: Corlis Leak  Hassell M.D.   On: 08/10/2018 16:47   Dg Abd 1 View  Result Date: 08/10/2018 CLINICAL DATA:  NG tube placed EXAM: ABDOMEN - 1 VIEW COMPARISON:  None. FINDINGS: Nasogastric tube with the tip projecting over the stomach. There is no bowel dilatation to suggest obstruction. There is no evidence of pneumoperitoneum, portal venous gas or pneumatosis. There are no pathologic calcifications along the expected course of the ureters. The osseous structures are unremarkable. IMPRESSION: Nasogastric tube with the tip projecting over the stomach. Electronically Signed   By: Elige KoHetal  Patel   On: 08/10/2018 13:34   Dg Chest Port 1 View  Result Date: 08/04/2018 CLINICAL DATA:  Elevated [place] blood cell count. EXAM: PORTABLE CHEST 1 VIEW COMPARISON:  07/30/2018. FINDINGS: Endotracheal to tip 4.8 cm above the carina. NG tube tip below left hemidiaphragm. Stable cardiomegaly. No pulmonary venous congestion. Partial clearing of right base infiltrate. No pleural effusion or pneumothorax IMPRESSION: 1.  Lines and tubes in stable position. 2.  Stable cardiomegaly. 3.  Partial clearing of right base infiltrate. Electronically Signed   By: Maisie Fushomas  Register   On: 08/04/2018 14:06   Dg Chest Port 1 View  Result Date: 07/30/2018 CLINICAL DATA:  Check nasogastric catheter and endotracheal tube placement  EXAM: PORTABLE CHEST 1 VIEW COMPARISON:  None. FINDINGS: Cardiac shadow is mildly enlarged. Endotracheal tube and gastric catheter are noted in satisfactory position. Patchy infiltrate in the right base is noted. No bony abnormality is seen. IMPRESSION: Right basilar infiltrate. Tubes and lines as described. Electronically Signed   By: Alcide CleverMark  Lukens M.D.   On: 07/30/2018 02:51   Dg Abd Portable 1v  Result Date: 07/30/2018 CLINICAL DATA:  Check gastric catheter placement EXAM: PORTABLE ABDOMEN - 1 VIEW COMPARISON:  None. FINDINGS: Gastric catheter is noted within the stomach. No obstructive changes are seen. No bony abnormality is noted. IMPRESSION: Gastric catheter within the stomach. Electronically Signed   By: Alcide CleverMark  Lukens M.D.   On: 07/30/2018 02:52   [place]as [place] Upper Extremity Venous Duplex  Result Date: 08/06/2018 UPPER VENOUS STUDY  Indications: Swelling Limitations: Poor ultrasound/tissue interface, line, bandages and ventilator, patient immobility, patient positioning. Performing Technologist: Chanda BusingGregory Collins RVT  Examination Guidelines: A complete evaluation includes B-mode imaging, spectral Doppler, color Doppler, and power Doppler as needed of all accessible portions of each vessel. Bilateral testing is considered an integral part of a complete examination. Limited examinations for reoccurring indications may be performed as noted.  Right Findings: +----------+------------+---------+-----------+----------+-------+  RIGHT      Compressible Phasicity Spontaneous Properties Summary  +----------+------------+---------+-----------+----------+-------+  IJV            Full        Yes        Yes                         +----------+------------+---------+-----------+----------+-------+  Subclavian     Full        Yes        Yes                         +----------+------------+---------+-----------+----------+-------+  Axillary       Full        Yes        Yes                          +----------+------------+---------+-----------+----------+-------+  Brachial       Full        Yes        Yes                         +----------+------------+---------+-----------+----------+-------+  Radial         Full                                               +----------+------------+---------+-----------+----------+-------+  Ulnar          Full                                               +----------+------------+---------+-----------+----------+-------+  Cephalic       Full                                               +----------+------------+---------+-----------+----------+-------+  Basilic        Full                                               +----------+------------+---------+-----------+----------+-------+  Left Findings: +----------+------------+---------+-----------+----------+-------+  LEFT       Compressible Phasicity Spontaneous Properties Summary  +----------+------------+---------+-----------+----------+-------+  IJV            Full        Yes        Yes                         +----------+------------+---------+-----------+----------+-------+  Subclavian     Full        Yes        Yes                         +----------+------------+---------+-----------+----------+-------+  Axillary       Full        Yes        Yes                         +----------+------------+---------+-----------+----------+-------+  Brachial       Full        Yes        Yes                         +----------+------------+---------+-----------+----------+-------+  Radial         Full                                               +----------+------------+---------+-----------+----------+-------+  Ulnar          Full                                               +----------+------------+---------+-----------+----------+-------+  Cephalic       Full                                               +----------+------------+---------+-----------+----------+-------+  Basilic        Full                                                +----------+------------+---------+-----------+----------+-------+  Summary:  Right: No evidence of deep vein thrombosis in the upper extremity. No evidence of superficial vein thrombosis in the upper extremity.  Left: No evidence of deep vein thrombosis in the upper extremity. No evidence of superficial vein thrombosis in the upper extremity.  *See table(s) above for measurements and observations.  Diagnosing physician: Sherald Hess MD Electronically signed by Sherald Hess MD on 08/06/2018 at 5:25:45 PM.    Final     Labs:  CBC: Recent Labs    08/04/18 0755 08/05/18 0539 08/07/18 0536 08/10/18 0717  WBC 24.4* 24.4* 15.5* 12.4*  HGB 9.4* 9.5* 8.8* 7.7*  HCT 25.6* 25.8* 24.2* 21.1*  PLT 206 201 205 200    COAGS: No results for input(s): INR, APTT in the last 8760 hours.  BMP: Recent Labs    08/07/18 0536 08/08/18 0449 08/09/18 0749 08/10/18 0717  NA 141 144 138 134*  K 4.3 4.0 3.5 3.8  CL 108 111 106 102  CO2 GLUCOSE 104* 111* 126* 114*  BUN 91* 79* 65* 58*  CALCIUM 8.8* 8.6* 8.3* 8.2*  CREATININE 2.42* 2.33* 2.04* 1.89*  GFRNONAA 20* 21* 25* 27*  GFRAA 23* 24* 29* 31*    LIVER FUNCTION TESTS: Recent Labs    07/31/18 0617 08/05/18 0539  BILITOT 0.7 0.4  AST 83* 148*  ALT 40 134*  ALKPHOS 93 118  PROT 3.2* 5.4*  ALBUMIN 2.5* 2.4*    TUMOR MARKERS: No results for input(s): AFPTM, CEA, CA199, CHROMGRNA in the last 8760 hours.  Assessment and Plan:  Aspiration pneumonia Dysphagia Anoxic encephalopathy Need for long term care Scheduled for possible gastric tube placement in IR 6/11 Risks and benefits discussed with the patient's husband via phone including, but not limited to the need for a barium enema during the procedure, bleeding, infection, peritonitis, or damage to  adjacent structures.  All of his questions were answered, he is agreeable to proceed. Consent signed and in chart.   Thank you for this interesting  consult.  I greatly enjoyed meeting C/Casia 10Victoria McCorkle Devargas and look forward to participating in their care.  A copy of this report was sent to the requesting provider on this date.  Electronically Signed: Robet LeuPamela A Easten Maceachern, PA-C 08/11/2018, 8:30 AM   I spent a total of 40 Minutes    in face to face in clinical consultation, greater than 50% of which was counseling/coordinating care for percutaneous gastric tube placement

## 2018-08-11 NOTE — Progress Notes (Signed)
[facility] Critical Care [facility]   PULMONARY CRITICAL CARE SERVICE  PROGRESS NOTE  Date of Service: 08/11/2018  Catherine BuggyVictoria McCorkle Trembath  VWU:[id number]  DOB: [birth year]   DOA: 07/30/2018  Referring Physician: Carron CurieAli Hijazi, MD  HPI: Catherine Pacheco is a 68 y.o. female seen for follow up of Acute on Chronic Respiratory Failure.  Patient currently is on pressure support mode the goal is 16 hours today  Medications: Reviewed on Rounds  Physical Exam:  Vitals: Temperature 98.7 pulse 70 respiratory rate 18 blood pressure 119/63 saturations 100%  Ventilator Settings mode ventilation is pressure support FiO2 28% tidal volume 600 pressure poor 12 PEEP 5  . General: Comfortable at this time . Eyes: Grossly normal lids, irises & conjunctiva . ENT: grossly tongue is normal . Neck: no obvious mass . Cardiovascular: S1 S2 normal no gallop . Respiratory: Coarse breath sounds no rhonchi . Abdomen: soft . Skin: no rash seen on limited exam . Musculoskeletal: not rigid . Psychiatric:unable to assess . Neurologic: no seizure no involuntary movements         Lab Data:   Basic Metabolic Panel: Recent Labs  Lab 08/05/18 0539 08/07/18 0536 08/08/18 0449 08/09/18 0749 08/10/18 0717  NA 141 141 144 138 134*  K 4.3 4.3 4.0 3.5 3.8  CL 108 108 111 106 102  CO2 20* 24 23 23 23   GLUCOSE 132* 104* 111* 126* 114*  BUN 82* 91* 79* 65* 58*  CREATININE 1.76* 2.42* 2.33* 2.04* 1.89*  CALCIUM 9.1 8.8* 8.6* 8.3* 8.2*  MG  --  2.1  --   --  1.7    ABG: No results for input(s): PHART, PCO2ART, PO2ART, HCO3, O2SAT in the last 168 hours.  Liver Function Tests: Recent Labs  Lab 08/05/18 0539  AST 148*  ALT 134*  ALKPHOS 118  BILITOT 0.4  PROT 5.4*  ALBUMIN 2.4*   No results for input(s): LIPASE, AMYLASE in the last 168 hours. No results for input(s): AMMONIA in the last 168 hours.  CBC: Recent Labs  Lab 08/05/18 0539 08/07/18 0536 08/10/18 0717   WBC 24.4* 15.5* 12.4*  HGB 9.5* 8.8* 7.7*  HCT 25.8* 24.2* 21.1*  MCV 85.1 85.5 84.7  PLT 201 205 200    Cardiac Enzymes: No results for input(s): CKTOTAL, CKMB, CKMBINDEX, TROPONINI in the last 168 hours.  BNP (last 3 results) No results for input(s): BNP in the last 8760 hours.  ProBNP (last 3 results) No results for input(s): PROBNP in the last 8760 hours.  Radiological Exams: Ct Abdomen Wo Contrast  Result Date: 08/10/2018 CLINICAL DATA:  Preop planning for gastrostomy tube EXAM: CT ABDOMEN WITHOUT CONTRAST TECHNIQUE: Multidetector CT imaging of the abdomen was performed following the standard protocol without IV contrast. COMPARISON:  None. FINDINGS: Lower chest: Scattered coronary calcifications. No pleural or pericardial effusion. Subpleural bleb in the inferior right middle lobe. Hepatobiliary: Unremarkable liver. Hyperdense material in the dependent aspect of the nondistended gallbladder fundus may represent mass, small gallstones, or vicarious excretion of contrast. No biliary ductal dilatation. Pancreas: Unremarkable. No pancreatic ductal dilatation or surrounding inflammatory changes. Spleen: Normal in size without focal abnormality. Adrenals/Urinary Tract: Adrenal glands are unremarkable. Renal arterial calcifications. Kidneys are normal, without renal calculi, focal lesion, or hydronephrosis. Stomach/Bowel: Nasogastric tube decompresses the stomach. The does appear to be appropriate percutaneous window for gastrostomy placement. Visualized small bowel and colon are nondilated. Scattered colonic diverticula. Vascular/Lymphatic: Heavy aortoiliac arterial calcifications without aneurysm. No abdominal or mesenteric adenopathy localized.  Other: No ascites. No free air. Musculoskeletal: Small umbilical hernia containing only mesenteric fat. Prominent Schmorl's node in the superior endplate of L2. Advanced spondylitic changes L3-S1. No acute fracture or worrisome bone lesion. IMPRESSION:  1. Satisfactory percutaneous window for gastrostomy placement. 2. Coronary and aortoiliac arterial calcifications. 3. Colonic diverticulosis. Electronically Signed   By: Lucrezia Europe M.D.   On: 08/10/2018 16:47   Dg Abd 1 View  Result Date: 08/10/2018 CLINICAL DATA:  NG tube placed EXAM: ABDOMEN - 1 VIEW COMPARISON:  None. FINDINGS: Nasogastric tube with the tip projecting over the stomach. There is no bowel dilatation to suggest obstruction. There is no evidence of pneumoperitoneum, portal venous gas or pneumatosis. There are no pathologic calcifications along the expected course of the ureters. The osseous structures are unremarkable. IMPRESSION: Nasogastric tube with the tip projecting over the stomach. Electronically Signed   By: Kathreen Devoid   On: 08/10/2018 13:34    Assessment/Plan Active Problems:   Acute on chronic respiratory failure with hypoxia (HCC)   Cardiac arrest (HCC)   COPD with chronic bronchitis (HCC)   Aspiration pneumonia due to gastric secretions ([place])   Anoxic brain injury ([place])   1. Acute on chronic respiratory failure with hypoxia we will continue with pressure support mode continue to advance the wean for 16-hour goal 2. Cardiac arrest rhythm has been stable we will continue to monitor 3. COPD continue with present management supportive care 4. Aspiration pneumonia follow radiologically 5. Anoxic brain injury grossly unchanged we will continue present management   I have personally seen and evaluated the patient, evaluated laboratory and imaging results, formulated the assessment and plan and placed orders. The Patient requires high complexity decision making for assessment and support.  Case was discussed on Rounds with the Respiratory Therapy Staff  Allyne Gee, MD [facility] Pulmonary Critical Care Medicine Sleep Medicine

## 2018-08-12 DIAGNOSIS — J9621 Acute and chronic respiratory failure with hypoxia: Secondary | ICD-10-CM | POA: Diagnosis not present

## 2018-08-12 DIAGNOSIS — G931 Anoxic brain damage, not elsewhere classified: Secondary | ICD-10-CM | POA: Diagnosis not present

## 2018-08-12 DIAGNOSIS — J690 Pneumonitis due to inhalation of food and vomit: Secondary | ICD-10-CM | POA: Diagnosis not present

## 2018-08-12 DIAGNOSIS — I469 Cardiac arrest, cause unspecified: Secondary | ICD-10-CM | POA: Diagnosis not present

## 2018-08-12 LAB — URINALYSIS, COMPLETE (UACMP) WITH MICROSCOPIC
Bilirubin Urine: NEGATIVE
Glucose, UA: NEGATIVE mg/dL
Ketones, ur: NEGATIVE mg/dL
Nitrite: NEGATIVE
Protein, ur: 100 mg/dL — AB
Specific Gravity, Urine: 1.01 (ref 1.005–1.030)
pH: 6 (ref 5.0–8.0)

## 2018-08-12 LAB — CBC
HCT: 20.8 % — ABNORMAL LOW (ref 36.0–46.0)
Hemoglobin: 7.5 g/dL — ABNORMAL LOW (ref 12.0–15.0)
MCH: 30.6 pg (ref 26.0–34.0)
MCHC: 36.1 g/dL — ABNORMAL HIGH (ref 30.0–36.0)
MCV: 84.9 fL (ref 80.0–100.0)
Platelets: 218 10*3/uL (ref 150–400)
RBC: 2.45 MIL/uL — ABNORMAL LOW (ref 3.87–5.11)
RDW: 14.4 % (ref 11.5–15.5)
WBC: 10.3 10*3/uL (ref 4.0–10.5)
nRBC: 0 % (ref 0.0–0.2)

## 2018-08-12 LAB — PROTIME-INR
INR: 1.1 (ref 0.8–1.2)
Prothrombin Time: 14.3 seconds (ref 11.4–15.2)

## 2018-08-12 NOTE — Progress Notes (Addendum)
Pulmonary Critical Care Medicine [place]   PULMONARY CRITICAL CARE SERVICE  PROGRESS NOTE  Date of Service: 08/12/2018  [name]  NKN:[id number]  DOB: 12-20-1950   DOA: 07/30/2018  Referring Physician: Merton Border, MD  HPI: Catherine Pacheco is a 68 y.o. female seen for follow up of Acute on Chronic Respiratory Failure.  Patient mains on aerosol trach collar 28% for 4-hour goal today.  Satting well currently with no distress.  Medications: Reviewed on Rounds  Physical Exam:  Vitals: Pulse 76 respirations 31 BP 139/78 O2 sat 92% temp 97.8  Ventilator Settings ATC 28%  . General: Comfortable at this time . Eyes: Grossly normal lids, irises & conjunctiva . ENT: grossly tongue is normal . Neck: no obvious mass . Cardiovascular: S1 S2 normal no gallop . Respiratory: No rales or rhonchi noted . Abdomen: soft . Skin: no rash seen on limited exam . Musculoskeletal: not rigid . Psychiatric:unable to assess . Neurologic: no seizure no involuntary movements         Lab Data:   Basic Metabolic Panel: Recent Labs  Lab 08/07/18 0536 08/08/18 0449 08/09/18 0749 08/10/18 0717  NA 141 144 138 134*  K 4.3 4.0 3.5 3.8  CL 108 111 106 102  CO2 24 23 23 23   GLUCOSE 104* 111* 126* 114*  BUN 91* 79* 65* 58*  CREATININE 2.42* 2.33* 2.04* 1.89*  CALCIUM 8.8* 8.6* 8.3* 8.2*  MG 2.1  --   --  1.7    ABG: No results for input(s): PHART, PCO2ART, PO2ART, HCO3, O2SAT in the last 168 hours.  Liver Function Tests: No results for input(s): AST, ALT, ALKPHOS, BILITOT, PROT, ALBUMIN in the last 168 hours. No results for input(s): LIPASE, AMYLASE in the last 168 hours. No results for input(s): AMMONIA in the last 168 hours.  CBC: Recent Labs  Lab 08/07/18 0536 08/10/18 0717 08/12/18 0621  WBC 15.5* 12.4* 10.3  HGB 8.8* 7.7* 7.5*  HCT 24.2* 21.1* 20.8*  MCV 85.5 84.7 84.9  PLT 205 200 218    Cardiac Enzymes: No results for input(s):  CKTOTAL, CKMB, CKMBINDEX, TROPONINI in the last 168 hours.  BNP (last 3 results) No results for input(s): BNP in the last 8760 hours.  ProBNP (last 3 results) No results for input(s): PROBNP in the last 8760 hours.  Radiological Exams: No results found.  Assessment/Plan Active Problems:   Acute on chronic respiratory failure with hypoxia (HCC)   Cardiac arrest (HCC)   COPD with chronic bronchitis (HCC)   Aspiration pneumonia due to gastric secretions (HCC)   Anoxic brain injury ([place])   1. Acute on chronic respiratory failure with hypoxia we will continue weaning 4-hour goal on T collar 2. Cardiac arrest rhythm is stable continue with supportive care 3. COPD advanced disease continue present management 4. Aspiration pneumonia treated 5. Anoxic brain injury grossly unchanged   I have personally seen and evaluated the patient, evaluated laboratory and imaging results, formulated the assessment and plan and placed orders. The Patient requires high complexity decision making for assessment and support.  Case was discussed on Rounds with the Respiratory Therapy Staff  Allyne Gee, MD [facility] Pulmonary Critical Care Medicine Sleep Medicine

## 2018-08-13 DIAGNOSIS — J690 Pneumonitis due to inhalation of food and vomit: Secondary | ICD-10-CM | POA: Diagnosis not present

## 2018-08-13 DIAGNOSIS — J9621 Acute and chronic respiratory failure with hypoxia: Secondary | ICD-10-CM | POA: Diagnosis not present

## 2018-08-13 DIAGNOSIS — G931 Anoxic brain damage, not elsewhere classified: Secondary | ICD-10-CM | POA: Diagnosis not present

## 2018-08-13 DIAGNOSIS — I469 Cardiac arrest, cause unspecified: Secondary | ICD-10-CM | POA: Diagnosis not present

## 2018-08-13 NOTE — Progress Notes (Signed)
Pulmonary Critical Care Medicine [place]   PULMONARY CRITICAL CARE SERVICE  PROGRESS NOTE  Date of Service: 08/13/2018  Adaleigh Warf  [id number]:[id number]  DOB: September 02, 1950   DOA: 07/30/2018  Referring Physician: Merton Border, MD  HPI: Catherine Pacheco is a 68 y.o. female seen for follow up of Acute on Chronic Respiratory Failure.  She is off the ventilator at this time is been on T collar at 28% FiO2  Medications: Reviewed on Rounds  Physical Exam:  Vitals: Temperature 98.3 pulse 65 respiratory 22 blood pressure 142/76 saturations 100%  Ventilator Settings off the ventilator on T collar right now  . General: Comfortable at this time . Eyes: Grossly normal lids, irises & conjunctiva . ENT: grossly tongue is normal . Neck: no obvious mass . Cardiovascular: S1 S2 normal no gallop . Respiratory: No rhonchi no rales . Abdomen: soft . Skin: no rash seen on limited exam . Musculoskeletal: not rigid . Psychiatric:unable to assess . Neurologic: no seizure no involuntary movements         Lab Data:   Basic Metabolic Panel: Recent Labs  Lab 08/07/18 0536 08/08/18 0449 08/09/18 0749 08/10/18 0717  NA 141 144 138 134*  K 4.3 4.0 3.5 3.8  CL 108 111 106 102  CO2 24 23 23 23   GLUCOSE 104* 111* 126* 114*  BUN 91* 79* 65* 58*  CREATININE 2.42* 2.33* 2.04* 1.89*  CALCIUM 8.8* 8.6* 8.3* 8.2*  MG 2.1  --   --  1.7    ABG: No results for input(s): PHART, PCO2ART, PO2ART, HCO3, O2SAT in the last 168 hours.  Liver Function Tests: No results for input(s): AST, ALT, ALKPHOS, BILITOT, PROT, ALBUMIN in the last 168 hours. No results for input(s): LIPASE, AMYLASE in the last 168 hours. No results for input(s): AMMONIA in the last 168 hours.  CBC: Recent Labs  Lab 08/07/18 0536 08/10/18 0717 08/12/18 0621  WBC 15.5* 12.4* 10.3  HGB 8.8* 7.7* 7.5*  HCT 24.2* 21.1* 20.8*  MCV 85.5 84.7 84.9  PLT 205 200 218    Cardiac Enzymes: No results  for input(s): CKTOTAL, CKMB, CKMBINDEX, TROPONINI in the last 168 hours.  BNP (last 3 results) No results for input(s): BNP in the last 8760 hours.  ProBNP (last 3 results) No results for input(s): PROBNP in the last 8760 hours.  Radiological Exams: No results found.  Assessment/Plan Active Problems:   Acute on chronic respiratory failure with hypoxia (HCC)   Cardiac arrest (HCC)   COPD with chronic bronchitis (HCC)   Aspiration pneumonia due to gastric secretions (HCC)   Anoxic brain injury ([place])   1. Acute on chronic respiratory failure with hypoxia we will continue weaning 8-hour goal on T collar 2. Cardiac arrest rhythm is stable continue with supportive care 3. COPD advanced disease continue present management 4. Aspiration pneumonia treated 5. Anoxic brain injury grossly unchanged   I have personally seen and evaluated the patient, evaluated laboratory and imaging results, formulated the assessment and plan and placed orders. The Patient requires high complexity decision making for assessment and support.  Case was discussed on Rounds with the Respiratory Therapy Staff  Allyne Gee, MD [facility] Pulmonary Critical Care Medicine Sleep Medicine

## 2018-08-14 ENCOUNTER — Other Ambulatory Visit (HOSPITAL_COMMUNITY): Payer: Medicare Other

## 2018-08-14 ENCOUNTER — Encounter (HOSPITAL_COMMUNITY): Payer: Self-pay | Admitting: Interventional Radiology

## 2018-08-14 DIAGNOSIS — J690 Pneumonitis due to inhalation of food and vomit: Secondary | ICD-10-CM | POA: Diagnosis not present

## 2018-08-14 DIAGNOSIS — J9621 Acute and chronic respiratory failure with hypoxia: Secondary | ICD-10-CM | POA: Diagnosis not present

## 2018-08-14 DIAGNOSIS — I469 Cardiac arrest, cause unspecified: Secondary | ICD-10-CM | POA: Diagnosis not present

## 2018-08-14 DIAGNOSIS — G931 Anoxic brain damage, not elsewhere classified: Secondary | ICD-10-CM | POA: Diagnosis not present

## 2018-08-14 HISTORY — PX: IR GASTROSTOMY TUBE MOD SED: IMG625

## 2018-08-14 MED ORDER — MIDAZOLAM HCL 2 MG/2ML IJ SOLN
INTRAMUSCULAR | Status: AC | PRN
  Administered 2018-08-14 (×2): 1 mg via INTRAVENOUS

## 2018-08-14 MED ORDER — VANCOMYCIN HCL IN DEXTROSE 1-5 GM/200ML-% IV SOLN
INTRAVENOUS | Status: AC
  Filled 2018-08-14: qty 200

## 2018-08-14 MED ORDER — GLUCAGON HCL RDNA (DIAGNOSTIC) 1 MG IJ SOLR
INTRAMUSCULAR | Status: AC
  Filled 2018-08-14: qty 1

## 2018-08-14 MED ORDER — Medication
Status: DC
Start: ? — End: 2018-08-14

## 2018-08-14 MED ORDER — GLUCAGON HCL RDNA (DIAGNOSTIC) 1 MG IJ SOLR
INTRAMUSCULAR | Status: AC | PRN
  Administered 2018-08-14: .5 mg via INTRAVENOUS

## 2018-08-14 MED ORDER — FENTANYL CITRATE (PF) 100 MCG/2ML IJ SOLN
INTRAMUSCULAR | Status: AC | PRN
  Administered 2018-08-14 (×2): 50 ug via INTRAVENOUS

## 2018-08-14 MED ORDER — LIDOCAINE HCL 1 % IJ SOLN
INTRAMUSCULAR | Status: AC
  Filled 2018-08-14: qty 20

## 2018-08-14 MED ORDER — GENERIC EXTERNAL MEDICATION
Status: DC
Start: ? — End: 2018-08-14

## 2018-08-14 MED ORDER — MIDAZOLAM HCL 2 MG/2ML IJ SOLN
INTRAMUSCULAR | Status: AC
  Filled 2018-08-14: qty 2

## 2018-08-14 MED ORDER — FENTANYL CITRATE (PF) 100 MCG/2ML IJ SOLN
INTRAMUSCULAR | Status: AC
  Filled 2018-08-14: qty 2

## 2018-08-14 MED ORDER — VANCOMYCIN HCL 500 MG IV SOLR
INTRAVENOUS | Status: AC | PRN
  Administered 2018-08-14: 1000 mg via INTRAVENOUS

## 2018-08-14 NOTE — Sedation Documentation (Signed)
Patient on ventilator. ETCO2 unavailable.

## 2018-08-14 NOTE — Progress Notes (Addendum)
Pulmonary Critical Care Medicine [place]   PULMONARY CRITICAL CARE SERVICE  PROGRESS NOTE  Date of Service: 08/14/2018  Catherine Pacheco  [id number]:[id number]  DOB: [birth year]   DOA: 07/30/2018  Referring Physician: Merton Border, MD  HPI: Catherine Pacheco is a 68 y.o. female seen for follow up of Acute on Chronic Respiratory Failure.  Patient remains on full support today mostly because she had her PEG placed and is resting at this time.  Currently requiring 28% FiO2.  Medications: Reviewed on Rounds  Physical Exam:  Vitals: Pulse 63 respirations 18 BP 122/66 O2 sat 99% temp 98.3  Ventilator Settings ventilator mode AC VC rate of 18 tidal volume 400 PEEP of 5 FiO2 of 28%  . General: Comfortable at this time . Eyes: Grossly normal lids, irises & conjunctiva . ENT: grossly tongue is normal . Neck: no obvious mass . Cardiovascular: S1 S2 normal no gallop . Respiratory: No rales or rhonchi noted . Abdomen: soft . Skin: no rash seen on limited exam . Musculoskeletal: not rigid . Psychiatric:unable to assess . Neurologic: no seizure no involuntary movements         Lab Data:   Basic Metabolic Panel: Recent Labs  Lab 08/08/18 0449 08/09/18 0749 08/10/18 0717  NA 144 138 134*  K 4.0 3.5 3.8  CL 111 106 102  CO2 23 23 23   GLUCOSE 111* 126* 114*  BUN 79* 65* 58*  CREATININE 2.33* 2.04* 1.89*  CALCIUM 8.6* 8.3* 8.2*  MG  --   --  1.7    ABG: No results for input(s): PHART, PCO2ART, PO2ART, HCO3, O2SAT in the last 168 hours.  Liver Function Tests: No results for input(s): AST, ALT, ALKPHOS, BILITOT, PROT, ALBUMIN in the last 168 hours. No results for input(s): LIPASE, AMYLASE in the last 168 hours. No results for input(s): AMMONIA in the last 168 hours.  CBC: Recent Labs  Lab 08/10/18 0717 08/12/18 0621  WBC 12.4* 10.3  HGB 7.7* 7.5*  HCT 21.1* 20.8*  MCV 84.7 84.9  PLT 200 218    Cardiac Enzymes: No results for input(s):  CKTOTAL, CKMB, CKMBINDEX, TROPONINI in the last 168 hours.  BNP (last 3 results) No results for input(s): BNP in the last 8760 hours.  ProBNP (last 3 results) No results for input(s): PROBNP in the last 8760 hours.  Radiological Exams: Ir Gastrostomy Tube Mod Sed  Result Date: 08/14/2018 INDICATION: Anoxic brain injury, dysphagia, malnutrition EXAM: FLUOROSCOPIC 20 FRENCH PULL-THROUGH GASTROSTOMY Date:  08/14/2018 08/14/2018 9:30 am Radiologist:  M. Daryll Brod, MD Guidance:  Fluoroscopic MEDICATIONS: Ancef 1 g; Antibiotics were administered within 1 hour of the procedure. Glucagon 0.5 mg IV ANESTHESIA/SEDATION: Versed 2 mg IV; Fentanyl 100 mcg IV Moderate Sedation Time:  20 minutes The patient was continuously monitored during the procedure by the interventional radiology nurse under my direct supervision. CONTRAST:  10 cc-administered into the gastric lumen. FLUOROSCOPY TIME:  Fluoroscopy Time: 1 minutes 54 seconds (10 mGy). COMPLICATIONS: None immediate. PROCEDURE: Informed consent was obtained from the patient following explanation of the procedure, risks, benefits and alternatives. The patient understands, agrees and consents for the procedure. All questions were addressed. A time out was performed. Maximal barrier sterile technique utilized including caps, mask, sterile gowns, sterile gloves, large sterile drape, hand hygiene, and betadine prep. The left upper quadrant was sterilely prepped and draped. An oral gastric catheter was inserted into the stomach under fluoroscopy. The existing nasogastric feeding tube was removed. Air was injected into the  stomach for insufflation and visualization under fluoroscopy. The air distended stomach was confirmed beneath the anterior abdominal wall in the frontal and lateral projections. Under sterile conditions and local anesthesia, a 17 gauge trocar needle was utilized to access the stomach percutaneously beneath the left subcostal margin. Needle position was  confirmed within the stomach under biplane fluoroscopy. Contrast injection confirmed position also. A single T tack was deployed for gastropexy. Over an Amplatz guide wire, a 9-French sheath was inserted into the stomach. A snare device was utilized to capture the oral gastric catheter. The snare device was pulled retrograde from the stomach up the esophagus and out the oropharynx. The 20-French pull-through gastrostomy was connected to the snare device and pulled antegrade through the oropharynx down the esophagus into the stomach and then through the percutaneous tract external to the patient. The gastrostomy was assembled externally. Contrast injection confirms position in the stomach. Images were obtained for documentation. The patient tolerated procedure well. No immediate complication. IMPRESSION: Fluoroscopic insertion of a 20-French "pull-through" gastrostomy. Electronically Signed   By: Judie PetitM.  Shick M.D.   On: 08/14/2018 09:36    Assessment/Plan Active Problems:   Acute on chronic respiratory failure with hypoxia (HCC)   Cardiac arrest (HCC)   COPD with chronic bronchitis (HCC)   Aspiration pneumonia due to gastric secretions (HCC)   Anoxic brain injury (HCC)   1. Acute on chronic respiratory failure with hypoxia continue full support at this time and resume weaning when patient is able.  Continue supportive measures. 2. Cardiac arrest rhythm is stable continue with supportive care 3. COPD advanced disease continue present management 4. Aspiration pneumonia treated 5. Anoxic brain injury grossly unchanged   I have personally seen and evaluated the patient, evaluated laboratory and imaging results, formulated the assessment and plan and placed orders. The Patient requires high complexity decision making for assessment and support.  Case was discussed on Rounds with the Respiratory Therapy Staff  Yevonne PaxSaadat A Orvella Digiulio, MD [facility] Pulmonary Critical Care Medicine Sleep Medicine

## 2018-08-14 NOTE — Procedures (Signed)
Anoxic brain injury  Dysphagia  S/p FLUORO 20 FR GTUBE  No comp Stable ebl min Full use tomorrow Report in pacs

## 2018-08-15 DIAGNOSIS — J690 Pneumonitis due to inhalation of food and vomit: Secondary | ICD-10-CM | POA: Diagnosis not present

## 2018-08-15 DIAGNOSIS — I469 Cardiac arrest, cause unspecified: Secondary | ICD-10-CM | POA: Diagnosis not present

## 2018-08-15 DIAGNOSIS — G931 Anoxic brain damage, not elsewhere classified: Secondary | ICD-10-CM | POA: Diagnosis not present

## 2018-08-15 DIAGNOSIS — J9621 Acute and chronic respiratory failure with hypoxia: Secondary | ICD-10-CM | POA: Diagnosis not present

## 2018-08-15 LAB — RENAL FUNCTION PANEL
Albumin: 1.9 g/dL — ABNORMAL LOW (ref 3.5–5.0)
Anion gap: 10 (ref 5–15)
BUN: 34 mg/dL — ABNORMAL HIGH (ref 8–23)
CO2: 24 mmol/L (ref 22–32)
Calcium: 8.5 mg/dL — ABNORMAL LOW (ref 8.9–10.3)
Chloride: 103 mmol/L (ref 98–111)
Creatinine, Ser: 1.42 mg/dL — ABNORMAL HIGH (ref 0.44–1.00)
GFR calc Af Amer: 44 mL/min — ABNORMAL LOW (ref >60)
GFR calc non Af Amer: 38 mL/min — ABNORMAL LOW (ref >60)
Glucose, Bld: 94 mg/dL (ref 70–99)
Phosphorus: 4.3 mg/dL (ref 2.5–4.6)
Potassium: 3.9 mmol/L (ref 3.5–5.1)
Sodium: 137 mmol/L (ref 135–145)

## 2018-08-15 LAB — CBC
HCT: 19.4 % — ABNORMAL LOW (ref 36.0–46.0)
Hemoglobin: 7 g/dL — ABNORMAL LOW (ref 12.0–15.0)
MCH: 31.3 pg (ref 26.0–34.0)
MCHC: 36.1 g/dL — ABNORMAL HIGH (ref 30.0–36.0)
MCV: 86.6 fL (ref 80.0–100.0)
Platelets: 223 10*3/uL (ref 150–400)
RBC: 2.24 MIL/uL — ABNORMAL LOW (ref 3.87–5.11)
RDW: 14.4 % (ref 11.5–15.5)
WBC: 6.7 10*3/uL (ref 4.0–10.5)
nRBC: 0 % (ref 0.0–0.2)

## 2018-08-15 LAB — MAGNESIUM: Magnesium: 1.6 mg/dL — ABNORMAL LOW (ref 1.7–2.4)

## 2018-08-15 NOTE — Progress Notes (Addendum)
[facility]   PULMONARY CRITICAL CARE SERVICE  PROGRESS NOTE  Date of Service: 08/15/2018  Catherine Pacheco  JXB:[id number]  DOB: 06-Dec-1950   DOA: 07/30/2018  Referring Physician: Carron CurieAli Hijazi, MD  HPI: Catherine Pacheco is a 68 y.o. female seen for follow up of Acute on Chronic Respiratory Failure.  Patient has a 12-hour goal on aerosol trach collar 28% FiO2 today.  Satting well with no fever or distress noted.  Medications: Reviewed on Rounds  Physical Exam:  Vitals: Pulse 78 respirations 22 BP 136/80 O2 sat 100% temp 98.1  Ventilator Settings ATC 28%  . General: Comfortable at this time . Eyes: Grossly normal lids, irises & conjunctiva . ENT: grossly tongue is normal . Neck: no obvious mass . Cardiovascular: S1 S2 normal no gallop . Respiratory: No rales or rhonchi noted . Abdomen: soft . Skin: no rash seen on limited exam . Musculoskeletal: not rigid . Psychiatric:unable to assess . Neurologic: no seizure no involuntary movements         Lab Data:   Basic Metabolic Panel: Recent Labs  Lab 08/09/18 0749 08/10/18 0717 08/15/18 0835  NA 138 134* 137  K 3.5 3.8 3.9  CL 106 102 103  CO2 23 23 24   GLUCOSE 126* 114* 94  BUN 65* 58* 34*  CREATININE 2.04* 1.89* 1.42*  CALCIUM 8.3* 8.2* 8.5*  MG  --  1.7 1.6*  PHOS  --   --  4.3    ABG: No results for input(s): PHART, PCO2ART, PO2ART, HCO3, O2SAT in the last 168 hours.  Liver Function Tests: Recent Labs  Lab 08/15/18 0835  ALBUMIN 1.9*   No results for input(s): LIPASE, AMYLASE in the last 168 hours. No results for input(s): AMMONIA in the last 168 hours.  CBC: Recent Labs  Lab 08/10/18 0717 08/12/18 0621 08/15/18 0835  WBC 12.4* 10.3 6.7  HGB 7.7* 7.5* 7.0*  HCT 21.1* 20.8* 19.4*  MCV 84.7 84.9 86.6  PLT 200 218 223    Cardiac Enzymes: No results for input(s): CKTOTAL, CKMB, CKMBINDEX, TROPONINI in the last 168 hours.  BNP  (last 3 results) No results for input(s): BNP in the last 8760 hours.  ProBNP (last 3 results) No results for input(s): PROBNP in the last 8760 hours.  Radiological Exams: Ir Gastrostomy Tube Mod Sed  Result Date: 08/14/2018 INDICATION: Anoxic brain injury, dysphagia, malnutrition EXAM: FLUOROSCOPIC 20 FRENCH PULL-THROUGH GASTROSTOMY Date:  08/14/2018 08/14/2018 9:30 am Radiologist:  M. Ruel Favorsrevor Shick, MD Guidance:  Fluoroscopic MEDICATIONS: Ancef 1 g; Antibiotics were administered within 1 hour of the procedure. Glucagon 0.5 mg IV ANESTHESIA/SEDATION: Versed 2 mg IV; Fentanyl 100 mcg IV Moderate Sedation Time:  20 minutes The patient was continuously monitored during the procedure by the interventional radiology nurse under my direct supervision. CONTRAST:  10 cc-administered into the gastric lumen. FLUOROSCOPY TIME:  Fluoroscopy Time: 1 minutes 54 seconds (10 mGy). COMPLICATIONS: None immediate. PROCEDURE: Informed consent was obtained from the patient following explanation of the procedure, risks, benefits and alternatives. The patient understands, agrees and consents for the procedure. All questions were addressed. A time out was performed. Maximal barrier sterile technique utilized including caps, mask, sterile gowns, sterile gloves, large sterile drape, hand hygiene, and betadine prep. The left upper quadrant was sterilely prepped and draped. An oral gastric catheter was inserted into the stomach under fluoroscopy. The existing nasogastric feeding tube was removed. Air was injected into the stomach for insufflation and visualization under fluoroscopy.  The air distended stomach was confirmed beneath the anterior abdominal wall in the frontal and lateral projections. Under sterile conditions and local anesthesia, a 50 gauge trocar needle was utilized to access the stomach percutaneously beneath the left subcostal margin. Needle position was confirmed within the stomach under biplane fluoroscopy. Contrast  injection confirmed position also. A single T tack was deployed for gastropexy. Over an Amplatz guide wire, a 9-French sheath was inserted into the stomach. A snare device was utilized to capture the oral gastric catheter. The snare device was pulled retrograde from the stomach up the esophagus and out the oropharynx. The 20-French pull-through gastrostomy was connected to the snare device and pulled antegrade through the oropharynx down the esophagus into the stomach and then through the percutaneous tract external to the patient. The gastrostomy was assembled externally. Contrast injection confirms position in the stomach. Images were obtained for documentation. The patient tolerated procedure well. No immediate complication. IMPRESSION: Fluoroscopic insertion of a 20-French "pull-through" gastrostomy. Electronically Signed   By: Jerilynn Mages.  Shick M.D.   On: 08/14/2018 09:36    Assessment/Plan Active Problems:   Acute on chronic respiratory failure with hypoxia (HCC)   Cardiac arrest (HCC)   COPD with chronic bronchitis (HCC)   Aspiration pneumonia due to gastric secretions ([place])   Anoxic brain injury ([place])   1. Acute on chronic respiratory failure with hypoxia continue aerosol trach collar trials at this time.  Continue weaning as patient tolerate.  Continue supportive management pulmonary toilet. 2. Cardiac arrest rhythm is stable continue with supportive care 3. COPD advanced disease continue present management 4. Aspiration pneumonia treated 5. Anoxic brain injury grossly unchanged   I have personally seen and evaluated the patient, evaluated laboratory and imaging results, formulated the assessment and plan and placed orders. The Patient requires high complexity decision making for assessment and support.  Case was discussed on Rounds with the Respiratory Therapy Staff  Allyne Gee, MD [facility] Pulmonary Critical Care Medicine Sleep Medicine

## 2018-08-15 NOTE — Progress Notes (Signed)
Patient ID: Catherine Pacheco, female   DOB: 08-Sep-1950, 68 y.o.   MRN: [id number] Pt s/p perc G tube 6/12; site appears stable with no sig bleeding ; abd soft; ok to use tube; maintain outer disc taut to skin surface

## 2018-08-16 DIAGNOSIS — J690 Pneumonitis due to inhalation of food and vomit: Secondary | ICD-10-CM | POA: Diagnosis not present

## 2018-08-16 DIAGNOSIS — I469 Cardiac arrest, cause unspecified: Secondary | ICD-10-CM | POA: Diagnosis not present

## 2018-08-16 DIAGNOSIS — G931 Anoxic brain damage, not elsewhere classified: Secondary | ICD-10-CM | POA: Diagnosis not present

## 2018-08-16 DIAGNOSIS — J9621 Acute and chronic respiratory failure with hypoxia: Secondary | ICD-10-CM | POA: Diagnosis not present

## 2018-08-16 LAB — CBC
HCT: 18.7 % — ABNORMAL LOW (ref 36.0–46.0)
Hemoglobin: 6.8 g/dL — CL (ref 12.0–15.0)
MCH: 31.2 pg (ref 26.0–34.0)
MCHC: 36.4 g/dL — ABNORMAL HIGH (ref 30.0–36.0)
MCV: 85.8 fL (ref 80.0–100.0)
Platelets: 222 10*3/uL (ref 150–400)
RBC: 2.18 MIL/uL — ABNORMAL LOW (ref 3.87–5.11)
RDW: 14.1 % (ref 11.5–15.5)
WBC: 6.5 10*3/uL (ref 4.0–10.5)
nRBC: 0 % (ref 0.0–0.2)

## 2018-08-16 LAB — ABO/RH: ABO/RH(D): B POS

## 2018-08-16 LAB — MAGNESIUM: Magnesium: 1.8 mg/dL (ref 1.7–2.4)

## 2018-08-16 LAB — PREPARE RBC (CROSSMATCH)

## 2018-08-16 NOTE — Progress Notes (Addendum)
Pulmonary Critical Care Medicine [place]   PULMONARY CRITICAL CARE SERVICE  PROGRESS NOTE  Date of Service: 08/16/2018  Catherine Pacheco  [id number]  DOB: [birth year]   DOA: 07/30/2018  Referring Physician: Merton Border, MD  HPI: Catherine Pacheco is a 68 y.o. female seen for follow up of Acute on Chronic Respiratory Failure.  Patient was able to tolerate 3 hours on aerosol trach collar today 28% FiO2 however hemoglobin was found to be low and patient will begin blood and is now back on full support on the ventilator.  Medications: Reviewed on Rounds  Physical Exam:  Vitals: Pulse 69 respirations 23 BP 132/70 O2 sat 100% temp 98.1  Ventilator Settings ventilator mode AC VC rate of 18 tidal line 400 PEEP 5 FiO2 of 28%  . General: Comfortable at this time . Eyes: Grossly normal lids, irises & conjunctiva . ENT: grossly tongue is normal . Neck: no obvious mass . Cardiovascular: S1 S2 normal no gallop . Respiratory: No rales or rhonchi noted . Abdomen: soft . Skin: no rash seen on limited exam . Musculoskeletal: not rigid . Psychiatric:unable to assess . Neurologic: no seizure no involuntary movements         Lab Data:   Basic Metabolic Panel: Recent Labs  Lab 08/10/18 0717 08/15/18 0835 08/16/18 0740  NA 134* 137  --   K 3.8 3.9  --   CL 102 103  --   CO2 23 24  --   GLUCOSE 114* 94  --   BUN 58* 34*  --   CREATININE 1.89* 1.42*  --   CALCIUM 8.2* 8.5*  --   MG 1.7 1.6* 1.8  PHOS  --  4.3  --     ABG: No results for input(s): PHART, PCO2ART, PO2ART, HCO3, O2SAT in the last 168 hours.  Liver Function Tests: Recent Labs  Lab 08/15/18 0835  ALBUMIN 1.9*   No results for input(s): LIPASE, AMYLASE in the last 168 hours. No results for input(s): AMMONIA in the last 168 hours.  CBC: Recent Labs  Lab 08/10/18 0717 08/12/18 0621 08/15/18 0835 08/16/18 0740  WBC 12.4* 10.3 6.7 6.5  HGB 7.7* 7.5* 7.0* 6.8*  HCT 21.1*  20.8* 19.4* 18.7*  MCV 84.7 84.9 86.6 85.8  PLT 200 218 223 222    Cardiac Enzymes: No results for input(s): CKTOTAL, CKMB, CKMBINDEX, TROPONINI in the last 168 hours.  BNP (last 3 results) No results for input(s): BNP in the last 8760 hours.  ProBNP (last 3 results) No results for input(s): PROBNP in the last 8760 hours.  Radiological Exams: No results found.  Assessment/Plan Active Problems:   Acute on chronic respiratory failure with hypoxia (HCC)   Cardiac arrest (HCC)   COPD with chronic bronchitis (HCC)   Aspiration pneumonia due to gastric secretions (HCC)   Anoxic brain injury ([place])   1. Acute on chronic respiratory failure with hypoxiacontinue aerosol trach collar trials at this time.  Continue weaning as patient tolerate.  Continue supportive management pulmonary toilet. 2. Cardiac arrest rhythm is stable continue with supportive care 3. COPD advanced disease continue present management 4. Aspiration pneumonia treated 5. Anoxic brain injury grossly unchanged   I have personally seen and evaluated the patient, evaluated laboratory and imaging results, formulated the assessment and plan and placed orders. The Patient requires high complexity decision making for assessment and support.  Case was discussed on Rounds with the Respiratory Therapy Staff  Allyne Gee, MD [facility] Pulmonary  Critical Care Medicine Sleep Medicine

## 2018-08-17 DIAGNOSIS — I469 Cardiac arrest, cause unspecified: Secondary | ICD-10-CM | POA: Diagnosis not present

## 2018-08-17 DIAGNOSIS — J690 Pneumonitis due to inhalation of food and vomit: Secondary | ICD-10-CM | POA: Diagnosis not present

## 2018-08-17 DIAGNOSIS — J9621 Acute and chronic respiratory failure with hypoxia: Secondary | ICD-10-CM | POA: Diagnosis not present

## 2018-08-17 DIAGNOSIS — G931 Anoxic brain damage, not elsewhere classified: Secondary | ICD-10-CM | POA: Diagnosis not present

## 2018-08-17 LAB — BASIC METABOLIC PANEL
Anion gap: 10 (ref 5–15)
BUN: 35 mg/dL — ABNORMAL HIGH (ref 8–23)
CO2: 24 mmol/L (ref 22–32)
Calcium: 8.5 mg/dL — ABNORMAL LOW (ref 8.9–10.3)
Chloride: 102 mmol/L (ref 98–111)
Creatinine, Ser: 1.4 mg/dL — ABNORMAL HIGH (ref 0.44–1.00)
GFR calc Af Amer: 45 mL/min — ABNORMAL LOW (ref >60)
GFR calc non Af Amer: 39 mL/min — ABNORMAL LOW (ref >60)
Glucose, Bld: 124 mg/dL — ABNORMAL HIGH (ref 70–99)
Potassium: 3.6 mmol/L (ref 3.5–5.1)
Sodium: 136 mmol/L (ref 135–145)

## 2018-08-17 LAB — CBC
HCT: 22.2 % — ABNORMAL LOW (ref 36.0–46.0)
Hemoglobin: 8.1 g/dL — ABNORMAL LOW (ref 12.0–15.0)
MCH: 30.6 pg (ref 26.0–34.0)
MCHC: 36.5 g/dL — ABNORMAL HIGH (ref 30.0–36.0)
MCV: 83.8 fL (ref 80.0–100.0)
Platelets: 219 10*3/uL (ref 150–400)
RBC: 2.65 MIL/uL — ABNORMAL LOW (ref 3.87–5.11)
RDW: 14.3 % (ref 11.5–15.5)
WBC: 7 10*3/uL (ref 4.0–10.5)
nRBC: 0.3 % — ABNORMAL HIGH (ref 0.0–0.2)

## 2018-08-17 LAB — TYPE AND SCREEN
ABO/RH(D): B POS
Antibody Screen: NEGATIVE
Unit division: 0

## 2018-08-17 LAB — BPAM RBC
Blood Product Expiration Date: 202006172359
ISSUE DATE / TIME: 202006141211
Unit Type and Rh: 1700

## 2018-08-17 LAB — PHOSPHORUS: Phosphorus: 2.7 mg/dL (ref 2.5–4.6)

## 2018-08-17 LAB — MAGNESIUM: Magnesium: 1.6 mg/dL — ABNORMAL LOW (ref 1.7–2.4)

## 2018-08-17 NOTE — Progress Notes (Addendum)
Pulmonary Critical Care Medicine [place]   PULMONARY CRITICAL CARE SERVICE  PROGRESS NOTE  Date of Service: 08/17/2018  Catherine Pacheco  [id number]  DOB: May 10, 1950   DOA: 07/30/2018  Referring Physician: Merton Border, MD  HPI: Catherine Pacheco is a 68 y.o. female seen for follow up of Acute on Chronic Respiratory Failure.  Patient continues on aerosol trach collar 28% FiO2 with a 16-hour goal today.  Satting well with no distress currently.  Medications: Reviewed on Rounds  Physical Exam:  Vitals: Pulse 75 respirations 30 BP 132/71 O2 sat 100% temp 97.4  Ventilator Settings AC 28%  . General: Comfortable at this time . Eyes: Grossly normal lids, irises & conjunctiva . ENT: grossly tongue is normal . Neck: no obvious mass . Cardiovascular: S1 S2 normal no gallop . Respiratory: No rales or rhonchi noted . Abdomen: soft . Skin: no rash seen on limited exam . Musculoskeletal: not rigid . Psychiatric:unable to assess . Neurologic: no seizure no involuntary movements         Lab Data:   Basic Metabolic Panel: Recent Labs  Lab 08/15/18 0835 08/16/18 0740 08/17/18 0644  NA 137  --  136  K 3.9  --  3.6  CL 103  --  102  CO2 24  --  24  GLUCOSE 94  --  124*  BUN 34*  --  35*  CREATININE 1.42*  --  1.40*  CALCIUM 8.5*  --  8.5*  MG 1.6* 1.8 1.6*  PHOS 4.3  --  2.7    ABG: No results for input(s): PHART, PCO2ART, PO2ART, HCO3, O2SAT in the last 168 hours.  Liver Function Tests: Recent Labs  Lab 08/15/18 0835  ALBUMIN 1.9*   No results for input(s): LIPASE, AMYLASE in the last 168 hours. No results for input(s): AMMONIA in the last 168 hours.  CBC: Recent Labs  Lab 08/12/18 0621 08/15/18 0835 08/16/18 0740 08/17/18 0644  WBC 10.3 6.7 6.5 7.0  HGB 7.5* 7.0* 6.8* 8.1*  HCT 20.8* 19.4* 18.7* 22.2*  MCV 84.9 86.6 85.8 83.8  PLT 218 223 222 219    Cardiac Enzymes: No results for input(s): CKTOTAL, CKMB,  CKMBINDEX, TROPONINI in the last 168 hours.  BNP (last 3 results) No results for input(s): BNP in the last 8760 hours.  ProBNP (last 3 results) No results for input(s): PROBNP in the last 8760 hours.  Radiological Exams: No results found.  Assessment/Plan Active Problems:   Acute on chronic respiratory failure with hypoxia (HCC)   Cardiac arrest (HCC)   COPD with chronic bronchitis (HCC)   Aspiration pneumonia due to gastric secretions (HCC)   Anoxic brain injury ([place])   1. Acute on chronic respiratory failure with hypoxiacontinueaerosol trach collar trials at this time. Continue weaning as patient tolerate. Continue supportive management pulmonary toilet. 2. Cardiac arrest rhythm is stable continue with supportive care 3. COPD advanced disease continue present management 4. Aspiration pneumonia treated 5. Anoxic brain injury grossly unchanged   I have personally seen and evaluated the patient, evaluated laboratory and imaging results, formulated the assessment and plan and placed orders. The Patient requires high complexity decision making for assessment and support.  Case was discussed on Rounds with the Respiratory Therapy Staff  Allyne Gee, MD [facility] Pulmonary Critical Care Medicine Sleep Medicine

## 2018-08-18 DIAGNOSIS — G931 Anoxic brain damage, not elsewhere classified: Secondary | ICD-10-CM | POA: Diagnosis not present

## 2018-08-18 DIAGNOSIS — J690 Pneumonitis due to inhalation of food and vomit: Secondary | ICD-10-CM | POA: Diagnosis not present

## 2018-08-18 DIAGNOSIS — I469 Cardiac arrest, cause unspecified: Secondary | ICD-10-CM | POA: Diagnosis not present

## 2018-08-18 DIAGNOSIS — J9621 Acute and chronic respiratory failure with hypoxia: Secondary | ICD-10-CM | POA: Diagnosis not present

## 2018-08-18 LAB — POTASSIUM: Potassium: 3.3 mmol/L — ABNORMAL LOW (ref 3.5–5.1)

## 2018-08-18 LAB — MAGNESIUM: Magnesium: 1.7 mg/dL (ref 1.7–2.4)

## 2018-08-18 NOTE — Progress Notes (Addendum)
Pulmonary Critical Care Medicine [place]   PULMONARY CRITICAL CARE SERVICE  PROGRESS NOTE  Date of Service: 08/18/2018  Catherine Pacheco  [id number]  DOB: 1951/01/21   DOA: 07/30/2018  Referring Physician: Merton Border, MD  HPI: Catherine Pacheco is a 68 y.o. female seen for follow up of Acute on Chronic Respiratory Failure.  Patient remains on 28% aerosol trach collar with a goal of 20 hours today.  Satting well with no distress currently.  Medications: Reviewed on Rounds  Physical Exam:  Vitals: Pulse 70 respirations 25 BP 127/73 O2 sat 100% temp 97.6  Ventilator Settings ATC 28%  . General: Comfortable at this time . Eyes: Grossly normal lids, irises & conjunctiva . ENT: grossly tongue is normal . Neck: no obvious mass . Cardiovascular: S1 S2 normal no gallop . Respiratory: No rales or rhonchi noted . Abdomen: soft . Skin: no rash seen on limited exam . Musculoskeletal: not rigid . Psychiatric:unable to assess . Neurologic: no seizure no involuntary movements         Lab Data:   Basic Metabolic Panel: Recent Labs  Lab 08/15/18 0835 08/16/18 0740 08/17/18 0644 08/18/18 0627  NA 137  --  136  --   K 3.9  --  3.6 3.3*  CL 103  --  102  --   CO2 24  --  24  --   GLUCOSE 94  --  124*  --   BUN 34*  --  35*  --   CREATININE 1.42*  --  1.40*  --   CALCIUM 8.5*  --  8.5*  --   MG 1.6* 1.8 1.6* 1.7  PHOS 4.3  --  2.7  --     ABG: No results for input(s): PHART, PCO2ART, PO2ART, HCO3, O2SAT in the last 168 hours.  Liver Function Tests: Recent Labs  Lab 08/15/18 0835  ALBUMIN 1.9*   No results for input(s): LIPASE, AMYLASE in the last 168 hours. No results for input(s): AMMONIA in the last 168 hours.  CBC: Recent Labs  Lab 08/12/18 0621 08/15/18 0835 08/16/18 0740 08/17/18 0644  WBC 10.3 6.7 6.5 7.0  HGB 7.5* 7.0* 6.8* 8.1*  HCT 20.8* 19.4* 18.7* 22.2*  MCV 84.9 86.6 85.8 83.8  PLT 218 223 222 219     Cardiac Enzymes: No results for input(s): CKTOTAL, CKMB, CKMBINDEX, TROPONINI in the last 168 hours.  BNP (last 3 results) No results for input(s): BNP in the last 8760 hours.  ProBNP (last 3 results) No results for input(s): PROBNP in the last 8760 hours.  Radiological Exams: No results found.  Assessment/Plan Active Problems:   Acute on chronic respiratory failure with hypoxia (HCC)   Cardiac arrest (HCC)   COPD with chronic bronchitis (HCC)   Aspiration pneumonia due to gastric secretions (HCC)   Anoxic brain injury ([place])   1. Acute on chronic respiratory failure with hypoxiacontinueaerosol trach collar trials at this time. Continue weaning as patient tolerate. Continue supportive management pulmonary toilet. 2. Cardiac arrest rhythm is stable continue with supportive care 3. COPD advanced disease continue present management 4. Aspiration pneumonia treated 5. Anoxic brain injury grossly unchanged   I have personally seen and evaluated the patient, evaluated laboratory and imaging results, formulated the assessment and plan and placed orders. The Patient requires high complexity decision making for assessment and support.  Case was discussed on Rounds with the Respiratory Therapy Staff  Allyne Gee, MD [facility] Pulmonary Critical Care Medicine Sleep Medicine

## 2018-08-19 ENCOUNTER — Other Ambulatory Visit (HOSPITAL_COMMUNITY): Payer: Medicare Other

## 2018-08-19 DIAGNOSIS — G931 Anoxic brain damage, not elsewhere classified: Secondary | ICD-10-CM | POA: Diagnosis not present

## 2018-08-19 DIAGNOSIS — I469 Cardiac arrest, cause unspecified: Secondary | ICD-10-CM | POA: Diagnosis not present

## 2018-08-19 DIAGNOSIS — J690 Pneumonitis due to inhalation of food and vomit: Secondary | ICD-10-CM | POA: Diagnosis not present

## 2018-08-19 DIAGNOSIS — J9621 Acute and chronic respiratory failure with hypoxia: Secondary | ICD-10-CM | POA: Diagnosis not present

## 2018-08-19 LAB — BASIC METABOLIC PANEL
Anion gap: 10 (ref 5–15)
BUN: 38 mg/dL — ABNORMAL HIGH (ref 8–23)
CO2: 28 mmol/L (ref 22–32)
Calcium: 9 mg/dL (ref 8.9–10.3)
Chloride: 100 mmol/L (ref 98–111)
Creatinine, Ser: 1.2 mg/dL — ABNORMAL HIGH (ref 0.44–1.00)
GFR calc Af Amer: 55 mL/min — ABNORMAL LOW (ref >60)
GFR calc non Af Amer: 47 mL/min — ABNORMAL LOW (ref >60)
Glucose, Bld: 122 mg/dL — ABNORMAL HIGH (ref 70–99)
Potassium: 3.6 mmol/L (ref 3.5–5.1)
Sodium: 138 mmol/L (ref 135–145)

## 2018-08-19 LAB — MAGNESIUM: Magnesium: 1.9 mg/dL (ref 1.7–2.4)

## 2018-08-19 NOTE — Progress Notes (Addendum)
Pulmonary Critical Care Medicine [place]   PULMONARY CRITICAL CARE SERVICE  PROGRESS NOTE  Date of Service: 08/19/2018  Catherine Pacheco  HMC:[id number]  DOB: 1950/12/30   DOA: 07/30/2018  Referring Physician: Merton Border, MD  HPI: Catherine Pacheco is a 68 y.o. female seen for follow up of Acute on Chronic Respiratory Failure.  Patient is 24-hour goal today on aerosol trach collar 28% FiO2 currently satting well with no fever or distress.  Medications: Reviewed on Rounds  Physical Exam:  Vitals: Pulse 94 respiration 26 BP 131/80 O2 sat 90% 27.1  Ventilator Settings aerosol trach collar 28%  . General: Comfortable at this time . Eyes: Grossly normal lids, irises & conjunctiva . ENT: grossly tongue is normal . Neck: no obvious mass . Cardiovascular: S1 S2 normal no gallop . Respiratory: No rales or rhonchi noted . Abdomen: soft . Skin: no rash seen on limited exam . Musculoskeletal: not rigid . Psychiatric:unable to assess . Neurologic: no seizure no involuntary movements         Lab Data:   Basic Metabolic Panel: Recent Labs  Lab 08/15/18 0835 08/16/18 0740 08/17/18 0644 08/18/18 0627 08/19/18 0722  NA 137  --  136  --  138  K 3.9  --  3.6 3.3* 3.6  CL 103  --  102  --  100  CO2 24  --  24  --  28  GLUCOSE 94  --  124*  --  122*  BUN 34*  --  35*  --  38*  CREATININE 1.42*  --  1.40*  --  1.20*  CALCIUM 8.5*  --  8.5*  --  9.0  MG 1.6* 1.8 1.6* 1.7 1.9  PHOS 4.3  --  2.7  --   --     ABG: No results for input(s): PHART, PCO2ART, PO2ART, HCO3, O2SAT in the last 168 hours.  Liver Function Tests: Recent Labs  Lab 08/15/18 0835  ALBUMIN 1.9*   No results for input(s): LIPASE, AMYLASE in the last 168 hours. No results for input(s): AMMONIA in the last 168 hours.  CBC: Recent Labs  Lab 08/15/18 0835 08/16/18 0740 08/17/18 0644  WBC 6.7 6.5 7.0  HGB 7.0* 6.8* 8.1*  HCT 19.4* 18.7* 22.2*  MCV 86.6 85.8 83.8   PLT 223 222 219    Cardiac Enzymes: No results for input(s): CKTOTAL, CKMB, CKMBINDEX, TROPONINI in the last 168 hours.  BNP (last 3 results) No results for input(s): BNP in the last 8760 hours.  ProBNP (last 3 results) No results for input(s): PROBNP in the last 8760 hours.  Radiological Exams: Dg Chest Port 1 View  Result Date: 08/19/2018 CLINICAL DATA:  Respiratory distress. EXAM: PORTABLE CHEST 1 VIEW COMPARISON:  08/04/2018 FINDINGS: Endotracheal and enteric tubes have been removed. A new tracheostomy tube overlies the airway. The cardiomediastinal silhouette is unchanged. Minimal right basilar opacity remains and may reflect atelectasis. No new airspace consolidation, edema, sizable pleural effusion, pneumothorax is identified. No acute osseous abnormality is seen. IMPRESSION: Interval tracheostomy tube placement. Minimal right basilar atelectasis. Electronically Signed   By: Logan Bores M.D.   On: 08/19/2018 11:54    Assessment/Plan Active Problems:   Acute on chronic respiratory failure with hypoxia (HCC)   Cardiac arrest (HCC)   COPD with chronic bronchitis (HCC)   Aspiration pneumonia due to gastric secretions ([place])   Anoxic brain injury ([place])   1. Acute on chronic respiratory failure with hypoxiacontinueaerosol trach collar trials at  this time. Continue weaning as patient tolerate. Continue supportive management pulmonary toilet. 2. Cardiac arrest rhythm is stable continue with supportive care 3. COPD advanced disease continue present management 4. Aspiration pneumonia treated 5. Anoxic brain injury grossly unchanged   I have personally seen and evaluated the patient, evaluated laboratory and imaging results, formulated the assessment and plan and placed orders. The Patient requires high complexity decision making for assessment and support.  Case was discussed on Rounds with the Respiratory Therapy Staff  Yevonne PaxSaadat A Vikash Nest, MD [facility] Pulmonary Critical Care  Medicine Sleep Medicine

## 2018-08-20 DIAGNOSIS — J690 Pneumonitis due to inhalation of food and vomit: Secondary | ICD-10-CM | POA: Diagnosis not present

## 2018-08-20 DIAGNOSIS — J9621 Acute and chronic respiratory failure with hypoxia: Secondary | ICD-10-CM | POA: Diagnosis not present

## 2018-08-20 DIAGNOSIS — I469 Cardiac arrest, cause unspecified: Secondary | ICD-10-CM | POA: Diagnosis not present

## 2018-08-20 DIAGNOSIS — G931 Anoxic brain damage, not elsewhere classified: Secondary | ICD-10-CM | POA: Diagnosis not present

## 2018-08-20 NOTE — Progress Notes (Addendum)
Pulmonary Critical Care Medicine [place]   PULMONARY CRITICAL CARE SERVICE  PROGRESS NOTE  Date of Service: 08/20/2018  Catherine Pacheco  YYQ:[id number]  DOB: Mar 21, 1950   DOA: 07/30/2018  Referring Physician: Merton Border, MD  HPI: Catherine Pacheco is a 68 y.o. female seen for follow up of Acute on Chronic Respiratory Failure.  Patient was on aerosol trach collar 28% FiO2 and began to have labored breathing with increased work of breathing.  Now on pressure support 12/5 FiO2 28%.  Medications: Reviewed on Rounds  Physical Exam:  Vitals: Pulse 68 respirations 34 BP 140/80 O2 sat 100% temp 97.8  Ventilator Settings pressure support 12/5 FiO2 20%  . General: Comfortable at this time . Eyes: Grossly normal lids, irises & conjunctiva . ENT: grossly tongue is normal . Neck: no obvious mass . Cardiovascular: S1 S2 normal no gallop . Respiratory: No rales or rhonchi noted . Abdomen: soft . Skin: no rash seen on limited exam . Musculoskeletal: not rigid . Psychiatric:unable to assess . Neurologic: no seizure no involuntary movements         Lab Data:   Basic Metabolic Panel: Recent Labs  Lab 08/15/18 0835 08/16/18 0740 08/17/18 0644 08/18/18 0627 08/19/18 0722  NA 137  --  136  --  138  K 3.9  --  3.6 3.3* 3.6  CL 103  --  102  --  100  CO2 24  --  24  --  28  GLUCOSE 94  --  124*  --  122*  BUN 34*  --  35*  --  38*  CREATININE 1.42*  --  1.40*  --  1.20*  CALCIUM 8.5*  --  8.5*  --  9.0  MG 1.6* 1.8 1.6* 1.7 1.9  PHOS 4.3  --  2.7  --   --     ABG: No results for input(s): PHART, PCO2ART, PO2ART, HCO3, O2SAT in the last 168 hours.  Liver Function Tests: Recent Labs  Lab 08/15/18 0835  ALBUMIN 1.9*   No results for input(s): LIPASE, AMYLASE in the last 168 hours. No results for input(s): AMMONIA in the last 168 hours.  CBC: Recent Labs  Lab 08/15/18 0835 08/16/18 0740 08/17/18 0644  WBC 6.7 6.5 7.0  HGB 7.0* 6.8*  8.1*  HCT 19.4* 18.7* 22.2*  MCV 86.6 85.8 83.8  PLT 223 222 219    Cardiac Enzymes: No results for input(s): CKTOTAL, CKMB, CKMBINDEX, TROPONINI in the last 168 hours.  BNP (last 3 results) No results for input(s): BNP in the last 8760 hours.  ProBNP (last 3 results) No results for input(s): PROBNP in the last 8760 hours.  Radiological Exams: Dg Chest Port 1 View  Result Date: 08/19/2018 CLINICAL DATA:  Respiratory distress. EXAM: PORTABLE CHEST 1 VIEW COMPARISON:  08/04/2018 FINDINGS: Endotracheal and enteric tubes have been removed. A new tracheostomy tube overlies the airway. The cardiomediastinal silhouette is unchanged. Minimal right basilar opacity remains and may reflect atelectasis. No new airspace consolidation, edema, sizable pleural effusion, pneumothorax is identified. No acute osseous abnormality is seen. IMPRESSION: Interval tracheostomy tube placement. Minimal right basilar atelectasis. Electronically Signed   By: Logan Bores M.D.   On: 08/19/2018 11:54    Assessment/Plan Active Problems:   Acute on chronic respiratory failure with hypoxia (HCC)   Cardiac arrest (HCC)   COPD with chronic bronchitis (HCC)   Aspiration pneumonia due to gastric secretions ([place])   Anoxic brain injury ([place])   1. Acute  on chronic respiratory failure with hypoxiacontinueaerosol trach collar trials at this time. Continue weaning as patient tolerate. Continue supportive management pulmonary toilet. 2. Cardiac arrest rhythm is stable continue with supportive care 3. COPD advanced disease continue present management 4. Aspiration pneumonia treated 5. Anoxic brain injury grossly unchanged   I have personally seen and evaluated the patient, evaluated laboratory and imaging results, formulated the assessment and plan and placed orders. The Patient requires high complexity decision making for assessment and support.  Case was discussed on Rounds with the Respiratory Therapy  Staff  Yevonne PaxSaadat A Wynonia Medero, MD [facility] Pulmonary Critical Care Medicine Sleep Medicine

## 2018-08-21 DIAGNOSIS — G931 Anoxic brain damage, not elsewhere classified: Secondary | ICD-10-CM | POA: Diagnosis not present

## 2018-08-21 DIAGNOSIS — J690 Pneumonitis due to inhalation of food and vomit: Secondary | ICD-10-CM | POA: Diagnosis not present

## 2018-08-21 DIAGNOSIS — J9621 Acute and chronic respiratory failure with hypoxia: Secondary | ICD-10-CM | POA: Diagnosis not present

## 2018-08-21 DIAGNOSIS — I469 Cardiac arrest, cause unspecified: Secondary | ICD-10-CM | POA: Diagnosis not present

## 2018-08-21 LAB — CBC
HCT: 40.7 % (ref 36.0–46.0)
Hemoglobin: 12.9 g/dL (ref 12.0–15.0)
MCH: 28.8 pg (ref 26.0–34.0)
MCHC: 31.7 g/dL (ref 30.0–36.0)
MCV: 90.8 fL (ref 80.0–100.0)
Platelets: 249 10*3/uL (ref 150–400)
RBC: 4.48 MIL/uL (ref 3.87–5.11)
RDW: 13.8 % (ref 11.5–15.5)
WBC: 10.7 10*3/uL — ABNORMAL HIGH (ref 4.0–10.5)
nRBC: 0 % (ref 0.0–0.2)

## 2018-08-21 LAB — MAGNESIUM: Magnesium: 2.2 mg/dL (ref 1.7–2.4)

## 2018-08-21 LAB — COMPREHENSIVE METABOLIC PANEL
ALT: 85 U/L — ABNORMAL HIGH (ref 0–44)
AST: 52 U/L — ABNORMAL HIGH (ref 15–41)
Albumin: 2.5 g/dL — ABNORMAL LOW (ref 3.5–5.0)
Alkaline Phosphatase: 153 U/L — ABNORMAL HIGH (ref 38–126)
Anion gap: 8 (ref 5–15)
BUN: 33 mg/dL — ABNORMAL HIGH (ref 8–23)
CO2: 34 mmol/L — ABNORMAL HIGH (ref 22–32)
Calcium: 8.8 mg/dL — ABNORMAL LOW (ref 8.9–10.3)
Chloride: 98 mmol/L (ref 98–111)
Creatinine, Ser: 1.57 mg/dL — ABNORMAL HIGH (ref 0.44–1.00)
GFR calc Af Amer: 39 mL/min — ABNORMAL LOW (ref >60)
GFR calc non Af Amer: 34 mL/min — ABNORMAL LOW (ref >60)
Glucose, Bld: 102 mg/dL — ABNORMAL HIGH (ref 70–99)
Potassium: 3.9 mmol/L (ref 3.5–5.1)
Sodium: 140 mmol/L (ref 135–145)
Total Bilirubin: 0.5 mg/dL (ref 0.3–1.2)
Total Protein: 6.5 g/dL (ref 6.5–8.1)

## 2018-08-21 NOTE — Progress Notes (Addendum)
Pulmonary Critical Care Medicine [place]   PULMONARY CRITICAL CARE SERVICE  PROGRESS NOTE  Date of Service: 08/21/2018  Catherine Pacheco  HDQ:[id number]  DOB: Oct 29, 1950   DOA: 07/30/2018  Referring Physician: Merton Border, MD  HPI: Catherine Pacheco is a 68 y.o. female seen for follow up of Acute on Chronic Respiratory Failure.  Patient continues on aerosol trach collar 28% FiO2 with a goal of 24 hours at this time.  Medications: Reviewed on Rounds  Physical Exam:  Vitals: Pulse 64 respirations 17 BP 147/80 O2 sat 100% temp 98.3  Ventilator Settings ATC 20%  . General: Comfortable at this time . Eyes: Grossly normal lids, irises & conjunctiva . ENT: grossly tongue is normal . Neck: no obvious mass . Cardiovascular: S1 S2 normal no gallop . Respiratory: No rales or rhonchi noted . Abdomen: soft . Skin: no rash seen on limited exam . Musculoskeletal: not rigid . Psychiatric:unable to assess . Neurologic: no seizure no involuntary movements         Lab Data:   Basic Metabolic Panel: Recent Labs  Lab 08/15/18 0835 08/16/18 0740 08/17/18 0644 08/18/18 0627 08/19/18 0722 08/21/18 0806  NA 137  --  136  --  138 140  K 3.9  --  3.6 3.3* 3.6 3.9  CL 103  --  102  --  100 98  CO2 24  --  24  --  28 34*  GLUCOSE 94  --  124*  --  122* 102*  BUN 34*  --  35*  --  38* 33*  CREATININE 1.42*  --  1.40*  --  1.20* 1.57*  CALCIUM 8.5*  --  8.5*  --  9.0 8.8*  MG 1.6* 1.8 1.6* 1.7 1.9 2.2  PHOS 4.3  --  2.7  --   --   --     ABG: No results for input(s): PHART, PCO2ART, PO2ART, HCO3, O2SAT in the last 168 hours.  Liver Function Tests: Recent Labs  Lab 08/15/18 0835 08/21/18 0806  AST  --  52*  ALT  --  85*  ALKPHOS  --  153*  BILITOT  --  0.5  PROT  --  6.5  ALBUMIN 1.9* 2.5*   No results for input(s): LIPASE, AMYLASE in the last 168 hours. No results for input(s): AMMONIA in the last 168 hours.  CBC: Recent Labs  Lab  08/15/18 0835 08/16/18 0740 08/17/18 0644 08/21/18 0806  WBC 6.7 6.5 7.0 10.7*  HGB 7.0* 6.8* 8.1* 12.9  HCT 19.4* 18.7* 22.2* 40.7  MCV 86.6 85.8 83.8 90.8  PLT 223 222 219 249    Cardiac Enzymes: No results for input(s): CKTOTAL, CKMB, CKMBINDEX, TROPONINI in the last 168 hours.  BNP (last 3 results) No results for input(s): BNP in the last 8760 hours.  ProBNP (last 3 results) No results for input(s): PROBNP in the last 8760 hours.  Radiological Exams: No results found.  Assessment/Plan Active Problems:   Acute on chronic respiratory failure with hypoxia (HCC)   Cardiac arrest (HCC)   COPD with chronic bronchitis (HCC)   Aspiration pneumonia due to gastric secretions (HCC)   Anoxic brain injury ([place])   1. Acute on chronic respiratory failure with hypoxiacontinueaerosol trach collar trials at this time.  Current goal is 24 hours.  Continue to wean as tolerated.  Continue supportive measures and pulmonary toilet. 2. Cardiac arrest rhythm is stable continue with supportive care 3. COPD advanced disease continue present management 4.  Aspiration pneumonia treated 5. Anoxic brain injury grossly unchanged   I have personally seen and evaluated the patient, evaluated laboratory and imaging results, formulated the assessment and plan and placed orders. The Patient requires high complexity decision making for assessment and support.  Case was discussed on Rounds with the Respiratory Therapy Staff  Yevonne PaxSaadat A Retha Bither, MD [facility]CC[facility] Pulmonary Critical Care Medicine Sleep Medicine

## 2018-08-22 ENCOUNTER — Other Ambulatory Visit (HOSPITAL_COMMUNITY): Payer: Medicare Other

## 2018-08-22 DIAGNOSIS — I469 Cardiac arrest, cause unspecified: Secondary | ICD-10-CM | POA: Diagnosis not present

## 2018-08-22 DIAGNOSIS — J690 Pneumonitis due to inhalation of food and vomit: Secondary | ICD-10-CM | POA: Diagnosis not present

## 2018-08-22 DIAGNOSIS — J9621 Acute and chronic respiratory failure with hypoxia: Secondary | ICD-10-CM | POA: Diagnosis not present

## 2018-08-22 DIAGNOSIS — G931 Anoxic brain damage, not elsewhere classified: Secondary | ICD-10-CM | POA: Diagnosis not present

## 2018-08-22 MED ORDER — Medication
Status: DC
Start: ? — End: 2018-08-22

## 2018-08-22 NOTE — Progress Notes (Addendum)
[facility]   PULMONARY CRITICAL CARE SERVICE  PROGRESS NOTE  Date of Service: 08/22/2018  Catherine BuggyVictoria McCorkle Pacheco  XBM:[id number]  DOB: 1951/02/23   DOA: 07/30/2018  Referring Physician: Carron CurieAli Hijazi, MD  HPI: Catherine Pacheco seen for follow up of Acute on Chronic Respiratory Failure.  Patient was able tolerate 8 hours on aerosol trach collar yesterday,   Medications: Reviewed on Rounds  Physical Exam:  Vitals: Pulse 73 respirations 17 BP 143/66 O2 sat 100% temp 98.6  Ventilator Settings ventilator mode AC VC rate of 18 tidal volume 400 PEEP 5 FiO2 28%  . General: Comfortable at this time . Eyes: Grossly normal lids, irises & conjunctiva . ENT: grossly tongue is normal . Neck: no obvious mass . Cardiovascular: S1 S2 normal no gallop . Respiratory: No rales or rhonchi noted . Abdomen: soft . Skin: no rash seen on limited exam . Musculoskeletal: not rigid . Psychiatric:unable to assess . Neurologic: no seizure no involuntary movements         Lab Data:   Basic Metabolic Panel: Recent Labs  Lab 08/16/18 0740 08/17/18 0644 08/18/18 0627 08/19/18 0722 08/21/18 0806  NA  --  136  --  138 140  K  --  3.6 3.3* 3.6 3.9  CL  --  102  --  100 98  CO2  --  24  --  28 34*  GLUCOSE  --  124*  --  122* 102*  BUN  --  35*  --  38* 33*  CREATININE  --  1.40*  --  1.20* 1.57*  CALCIUM  --  8.5*  --  9.0 8.8*  MG 1.8 1.6* 1.7 1.9 2.2  PHOS  --  2.7  --   --   --     ABG: No results for input(s): PHART, PCO2ART, PO2ART, HCO3, O2SAT in the last 168 hours.  Liver Function Tests: Recent Labs  Lab 08/21/18 0806  AST 52*  ALT 85*  ALKPHOS 153*  BILITOT 0.5  PROT 6.5  ALBUMIN 2.5*   No results for input(s): LIPASE, AMYLASE in the last 168 hours. No results for input(s): AMMONIA in the last 168 hours.  CBC: Recent Labs  Lab 08/16/18 0740 08/17/18 0644 08/21/18 0806  WBC 6.5 7.0 10.7*   HGB 6.8* 8.1* 12.9  HCT 18.7* 22.2* 40.7  MCV 85.8 83.8 90.8  PLT 222 219 249    Cardiac Enzymes: No results for input(s): CKTOTAL, CKMB, CKMBINDEX, TROPONINI in the last 168 hours.  BNP (last 3 results) No results for input(s): BNP in the last 8760 hours.  ProBNP (last 3 results) No results for input(s): PROBNP in the last 8760 hours.  Radiological Exams: Dg Chest Port 1 View  Result Date: 08/22/2018 CLINICAL DATA:  Concern for aspiration. EXAM: PORTABLE CHEST 1 VIEW COMPARISON:  08/19/2018; 08/04/2018 FINDINGS: Worse a unchanged cardiac silhouette and mediastinal contours with persistent thickening the right paratracheal stripe. Tracheostomy tube overlies the tracheal air column with tip superior to the carina. No pneumothorax. Improved aeration of the right lung base. No new focal airspace opacities. No pleural effusion or pneumothorax. No evidence of edema. No definite pleural effusions. No acute osseous abnormalities. IMPRESSION: 1.  Stable positioning of support apparatus.  No pneumothorax. 2. Improved aeration of the right lung base suggests resolved atelectasis. 3. No new focal airspace opacities to suggest aspiration. Electronically Signed   By: Simonne ComeJohn  Watts M.D.   On:  08/22/2018 13:12    Assessment/Plan Active Problems:   Acute on chronic respiratory failure with hypoxia (HCC)   Cardiac arrest (HCC)   COPD with chronic bronchitis (HCC)   Aspiration pneumonia due to gastric secretions (HCC)   Anoxic brain injury ([place])   1. Acute on chronic respiratory failure with hypoxiacontinueaerosol trach collar trials at this time.  Continue to wean as tolerated.  Continue supportive measures and pulmonary toilet. 2. Cardiac arrest rhythm is stable continue with supportive care 3. COPD advanced disease continue present management 4. Aspiration pneumonia treated 5. Anoxic brain injury grossly unchanged   I have personally seen and evaluated the patient, evaluated laboratory and  imaging results, formulated the assessment and plan and placed orders. The Patient requires high complexity decision making for assessment and support.  Case was discussed on Rounds with the Respiratory Therapy Staff  Allyne Gee, MD [facility] Pulmonary Critical Care Medicine Sleep Medicine

## 2018-08-23 DIAGNOSIS — G931 Anoxic brain damage, not elsewhere classified: Secondary | ICD-10-CM | POA: Diagnosis not present

## 2018-08-23 DIAGNOSIS — I469 Cardiac arrest, cause unspecified: Secondary | ICD-10-CM | POA: Diagnosis not present

## 2018-08-23 DIAGNOSIS — J9621 Acute and chronic respiratory failure with hypoxia: Secondary | ICD-10-CM | POA: Diagnosis not present

## 2018-08-23 DIAGNOSIS — J690 Pneumonitis due to inhalation of food and vomit: Secondary | ICD-10-CM | POA: Diagnosis not present

## 2018-08-23 LAB — URINALYSIS, ROUTINE W REFLEX MICROSCOPIC
Bilirubin Urine: NEGATIVE
Glucose, UA: NEGATIVE mg/dL
Hgb urine dipstick: NEGATIVE
Ketones, ur: NEGATIVE mg/dL
Leukocytes,Ua: NEGATIVE
Nitrite: NEGATIVE
Protein, ur: NEGATIVE mg/dL
Specific Gravity, Urine: 1.012 (ref 1.005–1.030)
pH: 7 (ref 5.0–8.0)

## 2018-08-23 NOTE — Progress Notes (Addendum)
Pulmonary Critical Care [facility]   PULMONARY CRITICAL CARE SERVICE  PROGRESS NOTE  Date of Service: 08/23/2018  Catherine Pacheco  ZOX:[id number]  DOB: [birth year]   DOA: 07/30/2018  Referring Physician: Carron CurieAli Hijazi, MD  HPI: Catherine BuggyVictoria McCorkle Pacheco is a 68 y.o. female seen for follow up of Acute on Chronic Respiratory Failure.  Patient continues on aerosol trach collar 28% FiO2 for goal 24 hours today.  Satting well with no chest.  Medications: Reviewed on Rounds  Physical Exam:  Vitals: Pulse 76 respirate 19 BP 148/80 O2 sat percent temp 99.0  Ventilator Settings ATC 28%  . General: Comfortable at this time . Eyes: Grossly normal lids, irises & conjunctiva . ENT: grossly tongue is normal . Neck: no obvious mass . Cardiovascular: S1 S2 normal no gallop . Respiratory: No rales or rhonchi noted . Abdomen: soft . Skin: no rash seen on limited exam . Musculoskeletal: not rigid . Psychiatric:unable to assess . Neurologic: no seizure no involuntary movements         Lab Data:   Basic Metabolic Panel: Recent Labs  Lab 08/17/18 0644 08/18/18 0627 08/19/18 0722 08/21/18 0806  NA 136  --  138 140  K 3.6 3.3* 3.6 3.9  CL 102  --  100 98  CO2 24  --  28 34*  GLUCOSE 124*  --  122* 102*  BUN 35*  --  38* 33*  CREATININE 1.40*  --  1.20* 1.57*  CALCIUM 8.5*  --  9.0 8.8*  MG 1.6* 1.7 1.9 2.2  PHOS 2.7  --   --   --     ABG: No results for input(s): PHART, PCO2ART, PO2ART, HCO3, O2SAT in the last 168 hours.  Liver Function Tests: Recent Labs  Lab 08/21/18 0806  AST 52*  ALT 85*  ALKPHOS 153*  BILITOT 0.5  PROT 6.5  ALBUMIN 2.5*   No results for input(s): LIPASE, AMYLASE in the last 168 hours. No results for input(s): AMMONIA in the last 168 hours.  CBC: Recent Labs  Lab 08/17/18 0644 08/21/18 0806  WBC 7.0 10.7*  HGB 8.1* 12.9  HCT 22.2* 40.7  MCV 83.8 90.8  PLT 219 249    Cardiac Enzymes: No results for  input(s): CKTOTAL, CKMB, CKMBINDEX, TROPONINI in the last 168 hours.  BNP (last 3 results) No results for input(s): BNP in the last 8760 hours.  ProBNP (last 3 results) No results for input(s): PROBNP in the last 8760 hours.  Radiological Exams: Dg Chest Port 1 View  Result Date: 08/22/2018 CLINICAL DATA:  Concern for aspiration. EXAM: PORTABLE CHEST 1 VIEW COMPARISON:  08/19/2018; 08/04/2018 FINDINGS: Worse a unchanged cardiac silhouette and mediastinal contours with persistent thickening the right paratracheal stripe. Tracheostomy tube overlies the tracheal air column with tip superior to the carina. No pneumothorax. Improved aeration of the right lung base. No new focal airspace opacities. No pleural effusion or pneumothorax. No evidence of edema. No definite pleural effusions. No acute osseous abnormalities. IMPRESSION: 1.  Stable positioning of support apparatus.  No pneumothorax. 2. Improved aeration of the right lung base suggests resolved atelectasis. 3. No new focal airspace opacities to suggest aspiration. Electronically Signed   By: Simonne ComeJohn  Watts M.D.   On: 08/22/2018 13:12    Assessment/Plan Active Problems:   Acute on chronic respiratory failure with hypoxia (HCC)   Cardiac arrest (HCC)   COPD with chronic bronchitis (HCC)   Aspiration pneumonia due to gastric secretions (HCC)   Anoxic  brain injury ([place])   1. Acute on chronic respiratory failure with hypoxiacontinueaerosol trach collar trials at this time.Continue to wean as tolerated. Continue supportive measures and pulmonary toilet. 2. Cardiac arrest rhythm is stable continue with supportive care 3. COPD advanced disease continue present management 4. Aspiration pneumonia treated 5. Anoxic brain injury grossly unchanged   I have personally seen and evaluated the patient, evaluated laboratory and imaging results, formulated the assessment and plan and placed orders. The Patient requires high complexity decision making  for assessment and support.  Case was discussed on Rounds with the Respiratory Therapy Staff  Allyne Gee, MD [place] Pulmonary Critical Care Medicine Sleep Medicine

## 2018-08-24 ENCOUNTER — Other Ambulatory Visit (HOSPITAL_COMMUNITY): Payer: Medicare Other

## 2018-08-24 DIAGNOSIS — J9621 Acute and chronic respiratory failure with hypoxia: Secondary | ICD-10-CM | POA: Diagnosis not present

## 2018-08-24 DIAGNOSIS — G931 Anoxic brain damage, not elsewhere classified: Secondary | ICD-10-CM | POA: Diagnosis not present

## 2018-08-24 DIAGNOSIS — J690 Pneumonitis due to inhalation of food and vomit: Secondary | ICD-10-CM | POA: Diagnosis not present

## 2018-08-24 DIAGNOSIS — I469 Cardiac arrest, cause unspecified: Secondary | ICD-10-CM | POA: Diagnosis not present

## 2018-08-24 LAB — CBC
HCT: 23.8 % — ABNORMAL LOW (ref 36.0–46.0)
Hemoglobin: 8.3 g/dL — ABNORMAL LOW (ref 12.0–15.0)
MCH: 30 pg (ref 26.0–34.0)
MCHC: 34.9 g/dL (ref 30.0–36.0)
MCV: 85.9 fL (ref 80.0–100.0)
Platelets: 315 10*3/uL (ref 150–400)
RBC: 2.77 MIL/uL — ABNORMAL LOW (ref 3.87–5.11)
RDW: 13.9 % (ref 11.5–15.5)
WBC: 11.2 10*3/uL — ABNORMAL HIGH (ref 4.0–10.5)
nRBC: 0 % (ref 0.0–0.2)

## 2018-08-24 LAB — URINE CULTURE

## 2018-08-24 LAB — BASIC METABOLIC PANEL
Anion gap: 11 (ref 5–15)
BUN: 52 mg/dL — ABNORMAL HIGH (ref 8–23)
CO2: 30 mmol/L (ref 22–32)
Calcium: 8.7 mg/dL — ABNORMAL LOW (ref 8.9–10.3)
Chloride: 97 mmol/L — ABNORMAL LOW (ref 98–111)
Creatinine, Ser: 1.18 mg/dL — ABNORMAL HIGH (ref 0.44–1.00)
GFR calc Af Amer: 56 mL/min — ABNORMAL LOW (ref >60)
GFR calc non Af Amer: 48 mL/min — ABNORMAL LOW (ref >60)
Glucose, Bld: 127 mg/dL — ABNORMAL HIGH (ref 70–99)
Potassium: 2.9 mmol/L — ABNORMAL LOW (ref 3.5–5.1)
Sodium: 138 mmol/L (ref 135–145)

## 2018-08-24 LAB — PHOSPHORUS: Phosphorus: 3.1 mg/dL (ref 2.5–4.6)

## 2018-08-24 LAB — MAGNESIUM: Magnesium: 1.7 mg/dL (ref 1.7–2.4)

## 2018-08-24 NOTE — Progress Notes (Signed)
[facility]   PULMONARY CRITICAL CARE SERVICE  PROGRESS NOTE  Date of Service: 08/24/2018  Catherine BuggyVictoria McCorkle Pacheco  ZOX:[id number]  DOB: 01-Jul-1950   DOA: 07/30/2018  Referring Physician: Carron CurieAli Hijazi, MD  HPI: Catherine Pacheco is a 68 y.o. female seen for follow up of Acute on Chronic Respiratory Failure.  Patient currently is on full support failed the spontaneous breathing trial  Medications: Reviewed on Rounds  Physical Exam:  Vitals: Temperature 98.7 pulse 81 respiratory 23 blood pressure 127/83 saturations 100%  Ventilator Settings mode of ventilation pressure assist control FiO2 28% inspiratory pressure 21 PEEP 5  . General: Comfortable at this time . Eyes: Grossly normal lids, irises & conjunctiva . ENT: grossly tongue is normal . Neck: no obvious mass . Cardiovascular: S1 S2 normal no gallop . Respiratory: No rhonchi no rales are noted at this time . Abdomen: soft . Skin: no rash seen on limited exam . Musculoskeletal: not rigid . Psychiatric:unable to assess . Neurologic: no seizure no involuntary movements         Lab Data:   Basic Metabolic Panel: Recent Labs  Lab 08/18/18 0627 08/19/18 0722 08/21/18 0806 08/24/18 0751  NA  --  138 140 138  K 3.3* 3.6 3.9 2.9*  CL  --  100 98 97*  CO2  --  28 34* 30  GLUCOSE  --  122* 102* 127*  BUN  --  38* 33* 52*  CREATININE  --  1.20* 1.57* 1.18*  CALCIUM  --  9.0 8.8* 8.7*  MG 1.7 1.9 2.2 1.7  PHOS  --   --   --  3.1    ABG: No results for input(s): PHART, PCO2ART, PO2ART, HCO3, O2SAT in the last 168 hours.  Liver Function Tests: Recent Labs  Lab 08/21/18 0806  AST 52*  ALT 85*  ALKPHOS 153*  BILITOT 0.5  PROT 6.5  ALBUMIN 2.5*   No results for input(s): LIPASE, AMYLASE in the last 168 hours. No results for input(s): AMMONIA in the last 168 hours.  CBC: Recent Labs  Lab 08/21/18 0806 08/24/18 0751  WBC 10.7* 11.2*  HGB 12.9 8.3*   HCT 40.7 23.8*  MCV 90.8 85.9  PLT 249 315    Cardiac Enzymes: No results for input(s): CKTOTAL, CKMB, CKMBINDEX, TROPONINI in the last 168 hours.  BNP (last 3 results) No results for input(s): BNP in the last 8760 hours.  ProBNP (last 3 results) No results for input(s): PROBNP in the last 8760 hours.  Radiological Exams: Ct Head Wo Contrast  Result Date: 08/24/2018 CLINICAL DATA:  Sinus infection.  History of anoxic brain injury. EXAM: CT HEAD WITHOUT CONTRAST TECHNIQUE: Contiguous axial images were obtained from the base of the skull through the vertex without intravenous contrast. COMPARISON:  None. FINDINGS: Brain: Mild atrophy. Negative for hydrocephalus. Symmetric low-density is seen throughout the basal ganglia bilaterally as well as in the thalamus bilaterally. Possible low-density in the pons. Findings suggest cerebral infarction corresponding to history of anoxic brain injury. Correlate with prior CT recommended. No acute cortical infarct. Negative for hemorrhage or mass. No midline shift. Vascular: Negative for hyperdense vessel Skull: No acute skull abnormality. Sinuses/Orbits: Extensive mucosal edema throughout the paranasal sinuses. Bilateral mastoid effusion. Soft tissue swelling of the left maxilla containing gas bubbles and possible abscess. Two displaced teeth are seen extending into the soft tissues with possible fracture or erosion of the maxilla on the left. Question dental procedure or trauma on  the left. Other: None IMPRESSION: 1. Low density throughout the basal ganglia and thalamus bilaterally consistent with anoxic brain injury. Correlate with prior CT. Negative for hemorrhage. 2. Erosive change in the left maxilla with disc placed teeth into the soft tissues. There is surrounding gas and fluid consistent with infection and possible abscess in the area. Question trauma or dental procedure left maxilla. Further evaluation with CT maxillofacial with intravenous contrast is  recommended to evaluate bony changes and for soft tissue abscess. Electronically Signed   By: Franchot Gallo M.D.   On: 08/24/2018 16:58    Assessment/Plan Active Problems:   Acute on chronic respiratory failure with hypoxia (HCC)   Cardiac arrest (HCC)   COPD with chronic bronchitis (HCC)   Aspiration pneumonia due to gastric secretions (HCC)   Anoxic brain injury ([place])   1. Acute on chronic respiratory failure hypoxia patient currently is on full support and pressure assist control mode was attempted at the spontaneous breathing trial however the patient failed 2. Cardiac arrest rhythm is stable 3. COPD acute exacerbation continue present management 4. Aspiration pneumonia treated at baseline 5. Anoxic brain injury supportive care grossly patient remains unchanged  I have personally seen and evaluated the patient, evaluated laboratory and imaging results, formulated the assessment and plan and placed orders. The Patient requires high complexity decision making for assessment and support.  Case was discussed on Rounds with the Respiratory Therapy Staff  Allyne Gee, MD [facility] Pulmonary Critical Care Medicine Sleep Medicine

## 2018-08-25 DIAGNOSIS — J9621 Acute and chronic respiratory failure with hypoxia: Secondary | ICD-10-CM | POA: Diagnosis not present

## 2018-08-25 DIAGNOSIS — J690 Pneumonitis due to inhalation of food and vomit: Secondary | ICD-10-CM | POA: Diagnosis not present

## 2018-08-25 DIAGNOSIS — I469 Cardiac arrest, cause unspecified: Secondary | ICD-10-CM | POA: Diagnosis not present

## 2018-08-25 DIAGNOSIS — G931 Anoxic brain damage, not elsewhere classified: Secondary | ICD-10-CM | POA: Diagnosis not present

## 2018-08-25 LAB — CULTURE, RESPIRATORY W GRAM STAIN: Culture: NORMAL

## 2018-08-25 LAB — BASIC METABOLIC PANEL
Anion gap: 10 (ref 5–15)
BUN: 50 mg/dL — ABNORMAL HIGH (ref 8–23)
CO2: 29 mmol/L (ref 22–32)
Calcium: 7.9 mg/dL — ABNORMAL LOW (ref 8.9–10.3)
Chloride: 101 mmol/L (ref 98–111)
Creatinine, Ser: 1.06 mg/dL — ABNORMAL HIGH (ref 0.44–1.00)
GFR calc Af Amer: >60 mL/min (ref >60)
GFR calc non Af Amer: 55 mL/min — ABNORMAL LOW (ref >60)
Glucose, Bld: 127 mg/dL — ABNORMAL HIGH (ref 70–99)
Potassium: 5 mmol/L (ref 3.5–5.1)
Sodium: 140 mmol/L (ref 135–145)

## 2018-08-25 LAB — MAGNESIUM: Magnesium: 1.7 mg/dL (ref 1.7–2.4)

## 2018-08-25 NOTE — Progress Notes (Signed)
[facility]   PULMONARY CRITICAL CARE SERVICE  PROGRESS NOTE  Date of Service: 08/25/2018  Catherine Pacheco  ZOX:[id number]  DOB: 07-Jan-1951   DOA: 07/30/2018  Referring Physician: Carron CurieAli Hijazi, MD  HPI: Catherine Pacheco is a 68 y.o. female seen for follow up of Acute on Chronic Respiratory Failure.  Patient currently is on pressure support mode has been on 28% FiO2  Medications: Reviewed on Rounds  Physical Exam:  Vitals: Temperature 99.0 pulse 72 respiratory rate 22 blood pressure 131/52 saturations 100%  Ventilator Settings mode ventilation pressure support FiO2 28% pressure 12 PEEP 5  . General: Comfortable at this time . Eyes: Grossly normal lids, irises & conjunctiva . ENT: grossly tongue is normal . Neck: no obvious mass . Cardiovascular: S1 S2 normal no gallop . Respiratory: No rhonchi no rales are noted at this time . Abdomen: soft . Skin: no rash seen on limited exam . Musculoskeletal: not rigid . Psychiatric:unable to assess . Neurologic: no seizure no involuntary movements         Lab Data:   Basic Metabolic Panel: Recent Labs  Lab 08/19/18 0722 08/21/18 0806 08/24/18 0751 08/25/18 0712  NA 138 140 138 140  K 3.6 3.9 2.9* 5.0  CL 100 98 97* 101  CO2 28 34* 30 29  GLUCOSE 122* 102* 127* 127*  BUN 38* 33* 52* 50*  CREATININE 1.20* 1.57* 1.18* 1.06*  CALCIUM 9.0 8.8* 8.7* 7.9*  MG 1.9 2.2 1.7 1.7  PHOS  --   --  3.1  --     ABG: No results for input(s): PHART, PCO2ART, PO2ART, HCO3, O2SAT in the last 168 hours.  Liver Function Tests: Recent Labs  Lab 08/21/18 0806  AST 52*  ALT 85*  ALKPHOS 153*  BILITOT 0.5  PROT 6.5  ALBUMIN 2.5*   No results for input(s): LIPASE, AMYLASE in the last 168 hours. No results for input(s): AMMONIA in the last 168 hours.  CBC: Recent Labs  Lab 08/21/18 0806 08/24/18 0751  WBC 10.7* 11.2*  HGB 12.9 8.3*  HCT 40.7 23.8*  MCV 90.8 85.9   PLT 249 315    Cardiac Enzymes: No results for input(s): CKTOTAL, CKMB, CKMBINDEX, TROPONINI in the last 168 hours.  BNP (last 3 results) No results for input(s): BNP in the last 8760 hours.  ProBNP (last 3 results) No results for input(s): PROBNP in the last 8760 hours.  Radiological Exams: Ct Head Wo Contrast  Result Date: 08/24/2018 CLINICAL DATA:  Sinus infection.  History of anoxic brain injury. EXAM: CT HEAD WITHOUT CONTRAST TECHNIQUE: Contiguous axial images were obtained from the base of the skull through the vertex without intravenous contrast. COMPARISON:  None. FINDINGS: Brain: Mild atrophy. Negative for hydrocephalus. Symmetric low-density is seen throughout the basal ganglia bilaterally as well as in the thalamus bilaterally. Possible low-density in the pons. Findings suggest cerebral infarction corresponding to history of anoxic brain injury. Correlate with prior CT recommended. No acute cortical infarct. Negative for hemorrhage or mass. No midline shift. Vascular: Negative for hyperdense vessel Skull: No acute skull abnormality. Sinuses/Orbits: Extensive mucosal edema throughout the paranasal sinuses. Bilateral mastoid effusion. Soft tissue swelling of the left maxilla containing gas bubbles and possible abscess. Two displaced teeth are seen extending into the soft tissues with possible fracture or erosion of the maxilla on the left. Question dental procedure or trauma on the left. Other: None IMPRESSION: 1. Low density throughout the basal ganglia and thalamus bilaterally  consistent with anoxic brain injury. Correlate with prior CT. Negative for hemorrhage. 2. Erosive change in the left maxilla with disc placed teeth into the soft tissues. There is surrounding gas and fluid consistent with infection and possible abscess in the area. Question trauma or dental procedure left maxilla. Further evaluation with CT maxillofacial with intravenous contrast is recommended to evaluate bony  changes and for soft tissue abscess. Electronically Signed   By: Franchot Gallo M.D.   On: 08/24/2018 16:58    Assessment/Plan Active Problems:   Acute on chronic respiratory failure with hypoxia (HCC)   Cardiac arrest (HCC)   COPD with chronic bronchitis (HCC)   Aspiration pneumonia due to gastric secretions (HCC)   Anoxic brain injury ([place])   1. Acute on chronic respiratory failure with hypoxia we will continue with pressure support for now and then try on T collar wean the goal for the T collar should be about 8 hours today 2. Cardiac arrest rhythm is stable we will continue to monitor 3. COPD at baseline we will continue present management 4. Aspiration pneumonia treated continue to follow 5. Anoxic brain injury grossly unchanged. 6. Abnormal CT of the head neck show some left maxillary erosive changes they are recommending a follow-up CT specific to the maxillofacial area contrast   I have personally seen and evaluated the patient, evaluated laboratory and imaging results, formulated the assessment and plan and placed orders. The Patient requires high complexity decision making for assessment and support.  Case was discussed on Rounds with the Respiratory Therapy Staff  Allyne Gee, MD [facility] Pulmonary Critical Care Medicine Sleep Medicine

## 2018-08-26 DIAGNOSIS — J9621 Acute and chronic respiratory failure with hypoxia: Secondary | ICD-10-CM | POA: Diagnosis not present

## 2018-08-26 DIAGNOSIS — G931 Anoxic brain damage, not elsewhere classified: Secondary | ICD-10-CM | POA: Diagnosis not present

## 2018-08-26 DIAGNOSIS — J690 Pneumonitis due to inhalation of food and vomit: Secondary | ICD-10-CM | POA: Diagnosis not present

## 2018-08-26 DIAGNOSIS — I469 Cardiac arrest, cause unspecified: Secondary | ICD-10-CM | POA: Diagnosis not present

## 2018-08-26 LAB — RENAL FUNCTION PANEL
Albumin: 1.6 g/dL — ABNORMAL LOW (ref 3.5–5.0)
Anion gap: 11 (ref 5–15)
BUN: 37 mg/dL — ABNORMAL HIGH (ref 8–23)
CO2: 29 mmol/L (ref 22–32)
Calcium: 8.8 mg/dL — ABNORMAL LOW (ref 8.9–10.3)
Chloride: 98 mmol/L (ref 98–111)
Creatinine, Ser: 1.12 mg/dL — ABNORMAL HIGH (ref 0.44–1.00)
GFR calc Af Amer: 59 mL/min — ABNORMAL LOW (ref >60)
GFR calc non Af Amer: 51 mL/min — ABNORMAL LOW (ref >60)
Glucose, Bld: 135 mg/dL — ABNORMAL HIGH (ref 70–99)
Phosphorus: 3.2 mg/dL (ref 2.5–4.6)
Potassium: 3.1 mmol/L — ABNORMAL LOW (ref 3.5–5.1)
Sodium: 138 mmol/L (ref 135–145)

## 2018-08-26 LAB — CBC
HCT: 24.3 % — ABNORMAL LOW (ref 36.0–46.0)
Hemoglobin: 8.4 g/dL — ABNORMAL LOW (ref 12.0–15.0)
MCH: 29.5 pg (ref 26.0–34.0)
MCHC: 34.6 g/dL (ref 30.0–36.0)
MCV: 85.3 fL (ref 80.0–100.0)
Platelets: 349 10*3/uL (ref 150–400)
RBC: 2.85 MIL/uL — ABNORMAL LOW (ref 3.87–5.11)
RDW: 13.9 % (ref 11.5–15.5)
WBC: 11.9 10*3/uL — ABNORMAL HIGH (ref 4.0–10.5)
nRBC: 0 % (ref 0.0–0.2)

## 2018-08-26 LAB — MAGNESIUM: Magnesium: 2.2 mg/dL (ref 1.7–2.4)

## 2018-08-26 NOTE — Progress Notes (Addendum)
[facility]   PULMONARY CRITICAL CARE SERVICE  PROGRESS NOTE  Date of Service: 08/26/2018  Catherine Pacheco  ZOX:[id number]  DOB: 05/27/1950   DOA: 07/30/2018  Referring Physician: Carron CurieAli Hijazi, MD  HPI: Catherine Pacheco is a 68 y.o. female seen for follow up of Acute on Chronic Respiratory Failure.  Patient remains on aerosol trach collar 20% FiO2 for goal of 12 hours today.  Medications: Reviewed on Rounds  Physical Exam:  Vitals: Pulse 87 respiration 26 BP 158/83 O2 sat 100% temp 98.3  Ventilator Settings ATC 28%  . General: Comfortable at this time . Eyes: Grossly normal lids, irises & conjunctiva . ENT: grossly tongue is normal . Neck: no obvious mass . Cardiovascular: S1 S2 normal no gallop . Respiratory: No rales or rhonchi noted . Abdomen: soft . Skin: no rash seen on limited exam . Musculoskeletal: not rigid . Psychiatric:unable to assess . Neurologic: no seizure no involuntary movements         Lab Data:   Basic Metabolic Panel: Recent Labs  Lab 08/21/18 0806 08/24/18 0751 08/25/18 0712 08/26/18 0731  NA 140 138 140 138  K 3.9 2.9* 5.0 3.1*  CL 98 97* 101 98  CO2 34* 30 29 29   GLUCOSE 102* 127* 127* 135*  BUN 33* 52* 50* 37*  CREATININE 1.57* 1.18* 1.06* 1.12*  CALCIUM 8.8* 8.7* 7.9* 8.8*  MG 2.2 1.7 1.7 2.2  PHOS  --  3.1  --  3.2    ABG: No results for input(s): PHART, PCO2ART, PO2ART, HCO3, O2SAT in the last 168 hours.  Liver Function Tests: Recent Labs  Lab 08/21/18 0806 08/26/18 0731  AST 52*  --   ALT 85*  --   ALKPHOS 153*  --   BILITOT 0.5  --   PROT 6.5  --   ALBUMIN 2.5* 1.6*   No results for input(s): LIPASE, AMYLASE in the last 168 hours. No results for input(s): AMMONIA in the last 168 hours.  CBC: Recent Labs  Lab 08/21/18 0806 08/24/18 0751 08/26/18 0731  WBC 10.7* 11.2* 11.9*  HGB 12.9 8.3* 8.4*  HCT 40.7 23.8* 24.3*  MCV 90.8 85.9 85.3  PLT  249 315 349    Cardiac Enzymes: No results for input(s): CKTOTAL, CKMB, CKMBINDEX, TROPONINI in the last 168 hours.  BNP (last 3 results) No results for input(s): BNP in the last 8760 hours.  ProBNP (last 3 results) No results for input(s): PROBNP in the last 8760 hours.  Radiological Exams: Ct Head Wo Contrast  Result Date: 08/24/2018 CLINICAL DATA:  Sinus infection.  History of anoxic brain injury. EXAM: CT HEAD WITHOUT CONTRAST TECHNIQUE: Contiguous axial images were obtained from the base of the skull through the vertex without intravenous contrast. COMPARISON:  None. FINDINGS: Brain: Mild atrophy. Negative for hydrocephalus. Symmetric low-density is seen throughout the basal ganglia bilaterally as well as in the thalamus bilaterally. Possible low-density in the pons. Findings suggest cerebral infarction corresponding to history of anoxic brain injury. Correlate with prior CT recommended. No acute cortical infarct. Negative for hemorrhage or mass. No midline shift. Vascular: Negative for hyperdense vessel Skull: No acute skull abnormality. Sinuses/Orbits: Extensive mucosal edema throughout the paranasal sinuses. Bilateral mastoid effusion. Soft tissue swelling of the left maxilla containing gas bubbles and possible abscess. Two displaced teeth are seen extending into the soft tissues with possible fracture or erosion of the maxilla on the left. Question dental procedure or trauma on the left. Other:  None IMPRESSION: 1. Low density throughout the basal ganglia and thalamus bilaterally consistent with anoxic brain injury. Correlate with prior CT. Negative for hemorrhage. 2. Erosive change in the left maxilla with disc placed teeth into the soft tissues. There is surrounding gas and fluid consistent with infection and possible abscess in the area. Question trauma or dental procedure left maxilla. Further evaluation with CT maxillofacial with intravenous contrast is recommended to evaluate bony  changes and for soft tissue abscess. Electronically Signed   By: Franchot Gallo M.D.   On: 08/24/2018 16:58    Assessment/Plan Active Problems:   Acute on chronic respiratory failure with hypoxia (HCC)   Cardiac arrest (HCC)   COPD with chronic bronchitis (HCC)   Aspiration pneumonia due to gastric secretions (HCC)   Anoxic brain injury ([place])   1. Acute on chronic respiratory failure with hypoxia continue with aerosol trach collar 20% for goal of 12 hours today continue aggressive pulmonary toilet supportive measures. 2. Cardiac arrest rhythm is stable we will continue to monitor 3. COPD at baseline we will continue present management 4. Aspiration pneumonia treated continue to follow 5. Anoxic brain injury grossly unchanged. 6. Abnormal CT of the head neck show some left maxillary erosive changes they are recommending a follow-up CT specific to the maxillofacial area contrast   I have personally seen and evaluated the patient, evaluated laboratory and imaging results, formulated the assessment and plan and placed orders. The Patient requires high complexity decision making for assessment and support.  Case was discussed on Rounds with the Respiratory Therapy Staff  Allyne Gee, MD [facility] Pulmonary Critical Care Medicine Sleep Medicine

## 2018-08-27 DIAGNOSIS — I469 Cardiac arrest, cause unspecified: Secondary | ICD-10-CM | POA: Diagnosis not present

## 2018-08-27 DIAGNOSIS — J690 Pneumonitis due to inhalation of food and vomit: Secondary | ICD-10-CM | POA: Diagnosis not present

## 2018-08-27 DIAGNOSIS — G931 Anoxic brain damage, not elsewhere classified: Secondary | ICD-10-CM | POA: Diagnosis not present

## 2018-08-27 DIAGNOSIS — J9621 Acute and chronic respiratory failure with hypoxia: Secondary | ICD-10-CM | POA: Diagnosis not present

## 2018-08-27 LAB — RENAL FUNCTION PANEL
Albumin: 1.7 g/dL — ABNORMAL LOW (ref 3.5–5.0)
Anion gap: 11 (ref 5–15)
BUN: 34 mg/dL — ABNORMAL HIGH (ref 8–23)
CO2: 29 mmol/L (ref 22–32)
Calcium: 8.9 mg/dL (ref 8.9–10.3)
Chloride: 99 mmol/L (ref 98–111)
Creatinine, Ser: 1.21 mg/dL — ABNORMAL HIGH (ref 0.44–1.00)
GFR calc Af Amer: 54 mL/min — ABNORMAL LOW (ref >60)
GFR calc non Af Amer: 47 mL/min — ABNORMAL LOW (ref >60)
Glucose, Bld: 133 mg/dL — ABNORMAL HIGH (ref 70–99)
Phosphorus: 3.9 mg/dL (ref 2.5–4.6)
Potassium: 3.6 mmol/L (ref 3.5–5.1)
Sodium: 139 mmol/L (ref 135–145)

## 2018-08-27 LAB — MAGNESIUM: Magnesium: 1.9 mg/dL (ref 1.7–2.4)

## 2018-08-27 LAB — PROCALCITONIN: Procalcitonin: <0.1 ng/mL

## 2018-08-27 NOTE — Progress Notes (Signed)
Pulmonary Critical Care Medicine [place]   PULMONARY CRITICAL CARE SERVICE  PROGRESS NOTE  Date of Service: 08/27/2018  Catherine Pacheco  AOZ:[id number]  DOB: 04/21/1950   DOA: 07/30/2018  Referring Physician: Merton Border, MD  HPI: Catherine Pacheco is a 68 y.o. female seen for follow up of Acute on Chronic Respiratory Failure.  Patient is doing well on T collar good saturations are noted at goal of 16 hours  Medications: Reviewed on Rounds  Physical Exam:  Vitals: Temperature 97.9 pulse 71 respiratory 23 blood pressure 124/69 saturations 100%  Ventilator Settings off the ventilator right now on T collar  . General: Comfortable at this time . Eyes: Grossly normal lids, irises & conjunctiva . ENT: grossly tongue is normal . Neck: no obvious mass . Cardiovascular: S1 S2 normal no gallop . Respiratory: No rhonchi no rales are noted at this time . Abdomen: soft . Skin: no rash seen on limited exam . Musculoskeletal: not rigid . Psychiatric:unable to assess . Neurologic: no seizure no involuntary movements         Lab Data:   Basic Metabolic Panel: Recent Labs  Lab 08/21/18 0806 08/24/18 0751 08/25/18 0712 08/26/18 0731 08/27/18 0851  NA 140 138 140 138 139  K 3.9 2.9* 5.0 3.1* 3.6  CL 98 97* 101 98 99  CO2 34* 30 29 29 29   GLUCOSE 102* 127* 127* 135* 133*  BUN 33* 52* 50* 37* 34*  CREATININE 1.57* 1.18* 1.06* 1.12* 1.21*  CALCIUM 8.8* 8.7* 7.9* 8.8* 8.9  MG 2.2 1.7 1.7 2.2 1.9  PHOS  --  3.1  --  3.2 3.9    ABG: No results for input(s): PHART, PCO2ART, PO2ART, HCO3, O2SAT in the last 168 hours.  Liver Function Tests: Recent Labs  Lab 08/21/18 0806 08/26/18 0731 08/27/18 0851  AST 52*  --   --   ALT 85*  --   --   ALKPHOS 153*  --   --   BILITOT 0.5  --   --   PROT 6.5  --   --   ALBUMIN 2.5* 1.6* 1.7*   No results for input(s): LIPASE, AMYLASE in the last 168 hours. No results for input(s): AMMONIA in the last  168 hours.  CBC: Recent Labs  Lab 08/21/18 0806 08/24/18 0751 08/26/18 0731  WBC 10.7* 11.2* 11.9*  HGB 12.9 8.3* 8.4*  HCT 40.7 23.8* 24.3*  MCV 90.8 85.9 85.3  PLT 249 315 349    Cardiac Enzymes: No results for input(s): CKTOTAL, CKMB, CKMBINDEX, TROPONINI in the last 168 hours.  BNP (last 3 results) No results for input(s): BNP in the last 8760 hours.  ProBNP (last 3 results) No results for input(s): PROBNP in the last 8760 hours.  Radiological Exams: No results found.  Assessment/Plan Active Problems:   Acute on chronic respiratory failure with hypoxia (HCC)   Cardiac arrest (HCC)   COPD with chronic bronchitis (HCC)   Aspiration pneumonia due to gastric secretions (HCC)   Anoxic brain injury ([place])   1. Acute on chronic respiratory failure with hypoxia continue 16-hour goal 2. Cardiac arrest rhythm stable continue present management 3. COPD at baseline we will continue supportive care 4. Aspiration pneumonia treated improved 5. Anoxic brain injury remains unchanged   I have personally seen and evaluated the patient, evaluated laboratory and imaging results, formulated the assessment and plan and placed orders. The Patient requires high complexity decision making for assessment and support.  Case was discussed  on Rounds with the Respiratory Therapy Staff  Allyne Gee, MD [facility] Pulmonary Critical Care Medicine Sleep Medicine

## 2018-08-28 DIAGNOSIS — J9621 Acute and chronic respiratory failure with hypoxia: Secondary | ICD-10-CM | POA: Diagnosis not present

## 2018-08-28 DIAGNOSIS — J690 Pneumonitis due to inhalation of food and vomit: Secondary | ICD-10-CM | POA: Diagnosis not present

## 2018-08-28 DIAGNOSIS — I469 Cardiac arrest, cause unspecified: Secondary | ICD-10-CM | POA: Diagnosis not present

## 2018-08-28 DIAGNOSIS — G931 Anoxic brain damage, not elsewhere classified: Secondary | ICD-10-CM | POA: Diagnosis not present

## 2018-08-28 NOTE — Progress Notes (Signed)
Pulmonary Critical Care Medicine [place]   PULMONARY CRITICAL CARE SERVICE  PROGRESS NOTE  Date of Service: 08/28/2018  Catherine Pacheco  XMI:[id number]  DOB: 1950-05-14   DOA: 07/30/2018  Referring Physician: Merton Border, MD  HPI: Catherine Pacheco is a 68 y.o. female seen for follow up of Acute on Chronic Respiratory Failure.  Currently is on T collar Doing well with the wean  Medications: Reviewed on Rounds  Physical Exam:  Vitals: Temperature 98.0 pulse 83 respiratory rate 20 blood pressure 135/98 saturations 100%  Ventilator Settings patient is on T collar FiO2 28%  . General: Comfortable at this time . Eyes: Grossly normal lids, irises & conjunctiva . ENT: grossly tongue is normal . Neck: no obvious mass . Cardiovascular: S1 S2 normal no gallop . Respiratory: No rhonchi no rales are noted . Abdomen: soft . Skin: no rash seen on limited exam . Musculoskeletal: not rigid . Psychiatric:unable to assess . Neurologic: no seizure no involuntary movements         Lab Data:   Basic Metabolic Panel: Recent Labs  Lab 08/24/18 0751 08/25/18 0712 08/26/18 0731 08/27/18 0851  NA 138 140 138 139  K 2.9* 5.0 3.1* 3.6  CL 97* 101 98 99  CO2 30 29 29 29   GLUCOSE 127* 127* 135* 133*  BUN 52* 50* 37* 34*  CREATININE 1.18* 1.06* 1.12* 1.21*  CALCIUM 8.7* 7.9* 8.8* 8.9  MG 1.7 1.7 2.2 1.9  PHOS 3.1  --  3.2 3.9    ABG: No results for input(s): PHART, PCO2ART, PO2ART, HCO3, O2SAT in the last 168 hours.  Liver Function Tests: Recent Labs  Lab 08/26/18 0731 08/27/18 0851  ALBUMIN 1.6* 1.7*   No results for input(s): LIPASE, AMYLASE in the last 168 hours. No results for input(s): AMMONIA in the last 168 hours.  CBC: Recent Labs  Lab 08/24/18 0751 08/26/18 0731  WBC 11.2* 11.9*  HGB 8.3* 8.4*  HCT 23.8* 24.3*  MCV 85.9 85.3  PLT 315 349    Cardiac Enzymes: No results for input(s): CKTOTAL, CKMB, CKMBINDEX, TROPONINI in  the last 168 hours.  BNP (last 3 results) No results for input(s): BNP in the last 8760 hours.  ProBNP (last 3 results) No results for input(s): PROBNP in the last 8760 hours.  Radiological Exams: No results found.  Assessment/Plan Active Problems:   Acute on chronic respiratory failure with hypoxia (HCC)   Cardiac arrest (HCC)   COPD with chronic bronchitis (HCC)   Aspiration pneumonia due to gastric secretions (HCC)   Anoxic brain injury ([place])   1. Acute on chronic respiratory failure with hypoxia we will continue with T collar trials titrate oxygen continue pulmonary toilet. 2. Cardiac arrest rhythm is stable 3. COPD with chronic bronchitis unchanged we will continue nebulizers as necessary 4. Aspiration pneumonia treated clinically improving 5. Anoxic brain injury grossly unchanged   I have personally seen and evaluated the patient, evaluated laboratory and imaging results, formulated the assessment and plan and placed orders. The Patient requires high complexity decision making for assessment and support.  Case was discussed on Rounds with the Respiratory Therapy Staff  Allyne Gee, MD [facility] Pulmonary Critical Care Medicine Sleep Medicine

## 2018-08-29 DIAGNOSIS — J690 Pneumonitis due to inhalation of food and vomit: Secondary | ICD-10-CM | POA: Diagnosis not present

## 2018-08-29 DIAGNOSIS — G931 Anoxic brain damage, not elsewhere classified: Secondary | ICD-10-CM | POA: Diagnosis not present

## 2018-08-29 DIAGNOSIS — I469 Cardiac arrest, cause unspecified: Secondary | ICD-10-CM | POA: Diagnosis not present

## 2018-08-29 DIAGNOSIS — J9621 Acute and chronic respiratory failure with hypoxia: Secondary | ICD-10-CM | POA: Diagnosis not present

## 2018-08-29 NOTE — Progress Notes (Addendum)
Pulmonary Critical Care Medicine [place]   PULMONARY CRITICAL CARE SERVICE  PROGRESS NOTE  Date of Service: 08/29/2018  Jolette Lana  OVZ:[id number]  DOB: 1950/05/29   DOA: 07/30/2018  Referring Physician: Merton Border, MD  HPI: Porschia Willbanks is a 68 y.o. female seen for follow up of Acute on Chronic Respiratory Failure. Pt continues on ATC 28% for a goal of 24 hours.  Pt is doing well with no distress at this time.  Medications: Reviewed on Rounds  Physical Exam:  Vitals: Pulse 71, Resp 26, BP 105/58, sat 100, temp 97.4  Ventilator Settings ATC 28%  . General: Comfortable at this time . Eyes: Grossly normal lids, irises & conjunctiva . ENT: grossly tongue is normal . Neck: no obvious mass . Cardiovascular: S1 S2 normal no gallop . Respiratory: No rales or ronchi noted . Abdomen: soft . Skin: no rash seen on limited exam . Musculoskeletal: not rigid . Psychiatric:unable to assess . Neurologic: no seizure no involuntary movements         Lab Data:   Basic Metabolic Panel: Recent Labs  Lab 08/24/18 0751 08/25/18 0712 08/26/18 0731 08/27/18 0851  NA 138 140 138 139  K 2.9* 5.0 3.1* 3.6  CL 97* 101 98 99  CO2 30 29 29 29   GLUCOSE 127* 127* 135* 133*  BUN 52* 50* 37* 34*  CREATININE 1.18* 1.06* 1.12* 1.21*  CALCIUM 8.7* 7.9* 8.8* 8.9  MG 1.7 1.7 2.2 1.9  PHOS 3.1  --  3.2 3.9    ABG: No results for input(s): PHART, PCO2ART, PO2ART, HCO3, O2SAT in the last 168 hours.  Liver Function Tests: Recent Labs  Lab 08/26/18 0731 08/27/18 0851  ALBUMIN 1.6* 1.7*   No results for input(s): LIPASE, AMYLASE in the last 168 hours. No results for input(s): AMMONIA in the last 168 hours.  CBC: Recent Labs  Lab 08/24/18 0751 08/26/18 0731  WBC 11.2* 11.9*  HGB 8.3* 8.4*  HCT 23.8* 24.3*  MCV 85.9 85.3  PLT 315 349    Cardiac Enzymes: No results for input(s): CKTOTAL, CKMB, CKMBINDEX, TROPONINI in the last 168  hours.  BNP (last 3 results) No results for input(s): BNP in the last 8760 hours.  ProBNP (last 3 results) No results for input(s): PROBNP in the last 8760 hours.  Radiological Exams: No results found.  Assessment/Plan Active Problems:   Acute on chronic respiratory failure with hypoxia (HCC)   Cardiac arrest (HCC)   COPD with chronic bronchitis (HCC)   Aspiration pneumonia due to gastric secretions (HCC)   Anoxic brain injury ([place])   1. Acute on chronic respiratory failure with hypoxia we will continue with T collar trials titrate oxygen continue pulmonary toilet. 2. Cardiac arrest rhythm is stable 3. COPD with chronic bronchitis unchanged we will continue nebulizers as necessary 4. Aspiration pneumonia treated clinically improving 5. Anoxic brain injury grossly unchanged   I have personally seen and evaluated the patient, evaluated laboratory and imaging results, formulated the assessment and plan and placed orders. The Patient requires high complexity decision making for assessment and support.  Case was discussed on Rounds with the Respiratory Therapy Staff  Allyne Gee, MD [facility] Pulmonary Critical Care Medicine Sleep Medicine

## 2018-08-30 DIAGNOSIS — G931 Anoxic brain damage, not elsewhere classified: Secondary | ICD-10-CM | POA: Diagnosis not present

## 2018-08-30 DIAGNOSIS — J690 Pneumonitis due to inhalation of food and vomit: Secondary | ICD-10-CM | POA: Diagnosis not present

## 2018-08-30 DIAGNOSIS — J9621 Acute and chronic respiratory failure with hypoxia: Secondary | ICD-10-CM | POA: Diagnosis not present

## 2018-08-30 DIAGNOSIS — I469 Cardiac arrest, cause unspecified: Secondary | ICD-10-CM | POA: Diagnosis not present

## 2018-08-30 LAB — MAGNESIUM: Magnesium: 2.2 mg/dL (ref 1.7–2.4)

## 2018-08-30 NOTE — Progress Notes (Addendum)
Pulmonary Critical Care Medicine [place]   PULMONARY CRITICAL CARE SERVICE  PROGRESS NOTE  Date of Service: 08/30/2018  Catherine Pacheco  XBJ:[id number]  DOB: 10-24-1950   DOA: 07/30/2018  Referring Physician: Merton Border, MD  HPI: Catherine Pacheco is a 68 y.o. female seen for follow up of Acute on Chronic Respiratory Failure.  Patient remains on aerosol trach collar 28% FiO2 for 48-hour goal at this time.  Satting well no distress.  Medications: Reviewed on Rounds  Physical Exam:  Vitals: Pulse 76 respirations 25 BP 95/67 O2 sat 100% 27.4  Ventilator Settings ATC 28%  . General: Comfortable at this time . Eyes: Grossly normal lids, irises & conjunctiva . ENT: grossly tongue is normal . Neck: no obvious mass . Cardiovascular: S1 S2 normal no gallop . Respiratory: No rales or rhonchi noted . Abdomen: soft . Skin: no rash seen on limited exam . Musculoskeletal: not rigid . Psychiatric:unable to assess . Neurologic: no seizure no involuntary movements         Lab Data:   Basic Metabolic Panel: Recent Labs  Lab 08/24/18 0751 08/25/18 0712 08/26/18 0731 08/27/18 0851 08/30/18 1050  NA 138 140 138 139  --   K 2.9* 5.0 3.1* 3.6  --   CL 97* 101 98 99  --   CO2 30 29 29 29   --   GLUCOSE 127* 127* 135* 133*  --   BUN 52* 50* 37* 34*  --   CREATININE 1.18* 1.06* 1.12* 1.21*  --   CALCIUM 8.7* 7.9* 8.8* 8.9  --   MG 1.7 1.7 2.2 1.9 2.2  PHOS 3.1  --  3.2 3.9  --     ABG: No results for input(s): PHART, PCO2ART, PO2ART, HCO3, O2SAT in the last 168 hours.  Liver Function Tests: Recent Labs  Lab 08/26/18 0731 08/27/18 0851  ALBUMIN 1.6* 1.7*   No results for input(s): LIPASE, AMYLASE in the last 168 hours. No results for input(s): AMMONIA in the last 168 hours.  CBC: Recent Labs  Lab 08/24/18 0751 08/26/18 0731  WBC 11.2* 11.9*  HGB 8.3* 8.4*  HCT 23.8* 24.3*  MCV 85.9 85.3  PLT 315 349    Cardiac Enzymes: No  results for input(s): CKTOTAL, CKMB, CKMBINDEX, TROPONINI in the last 168 hours.  BNP (last 3 results) No results for input(s): BNP in the last 8760 hours.  ProBNP (last 3 results) No results for input(s): PROBNP in the last 8760 hours.  Radiological Exams: No results found.  Assessment/Plan Active Problems:   Acute on chronic respiratory failure with hypoxia (HCC)   Cardiac arrest (HCC)   COPD with chronic bronchitis (HCC)   Aspiration pneumonia due to gastric secretions (HCC)   Anoxic brain injury ([place])   1. Acute on chronic respiratory failure with hypoxia we will continue with T collar trials titrate oxygen continue pulmonary toilet. 2. Cardiac arrest rhythm is stable 3. COPD with chronic bronchitis unchanged we will continue nebulizers as necessary 4. Aspiration pneumonia treated clinically improving 5. Anoxic brain injury grossly unchanged   I have personally seen and evaluated the patient, evaluated laboratory and imaging results, formulated the assessment and plan and placed orders. The Patient requires high complexity decision making for assessment and support.  Case was discussed on Rounds with the Respiratory Therapy Staff  Allyne Gee, MD [facility] Pulmonary Critical Care Medicine Sleep Medicine

## 2018-08-31 DIAGNOSIS — G931 Anoxic brain damage, not elsewhere classified: Secondary | ICD-10-CM | POA: Diagnosis not present

## 2018-08-31 DIAGNOSIS — J690 Pneumonitis due to inhalation of food and vomit: Secondary | ICD-10-CM | POA: Diagnosis not present

## 2018-08-31 DIAGNOSIS — J9621 Acute and chronic respiratory failure with hypoxia: Secondary | ICD-10-CM | POA: Diagnosis not present

## 2018-08-31 DIAGNOSIS — I469 Cardiac arrest, cause unspecified: Secondary | ICD-10-CM | POA: Diagnosis not present

## 2018-08-31 LAB — CBC
HCT: 23.2 % — ABNORMAL LOW (ref 36.0–46.0)
Hemoglobin: 8 g/dL — ABNORMAL LOW (ref 12.0–15.0)
MCH: 29.6 pg (ref 26.0–34.0)
MCHC: 34.5 g/dL (ref 30.0–36.0)
MCV: 85.9 fL (ref 80.0–100.0)
Platelets: 368 10*3/uL (ref 150–400)
RBC: 2.7 MIL/uL — ABNORMAL LOW (ref 3.87–5.11)
RDW: 14.5 % (ref 11.5–15.5)
WBC: 12.5 10*3/uL — ABNORMAL HIGH (ref 4.0–10.5)
nRBC: 0 % (ref 0.0–0.2)

## 2018-08-31 LAB — BASIC METABOLIC PANEL
Anion gap: 11 (ref 5–15)
BUN: 27 mg/dL — ABNORMAL HIGH (ref 8–23)
CO2: 27 mmol/L (ref 22–32)
Calcium: 9.1 mg/dL (ref 8.9–10.3)
Chloride: 100 mmol/L (ref 98–111)
Creatinine, Ser: 1.19 mg/dL — ABNORMAL HIGH (ref 0.44–1.00)
GFR calc Af Amer: 55 mL/min — ABNORMAL LOW (ref >60)
GFR calc non Af Amer: 48 mL/min — ABNORMAL LOW (ref >60)
Glucose, Bld: 112 mg/dL — ABNORMAL HIGH (ref 70–99)
Potassium: 3.7 mmol/L (ref 3.5–5.1)
Sodium: 138 mmol/L (ref 135–145)

## 2018-08-31 LAB — MAGNESIUM: Magnesium: 1.8 mg/dL (ref 1.7–2.4)

## 2018-08-31 NOTE — Progress Notes (Signed)
Pulmonary Critical Care Medicine [place]   PULMONARY CRITICAL CARE SERVICE  PROGRESS NOTE  Date of Service: 08/31/2018  Catherine Pacheco  [id number]:[id number]  DOB: 1950-07-27   DOA: 07/30/2018  Referring Physician: Merton Border, MD  HPI: Catherine Pacheco is a 68 y.o. female seen for follow up of Acute on Chronic Respiratory Failure.  Patient is on T collar has been on 28% FiO2 and the goal was for 48 hours  Medications: Reviewed on Rounds  Physical Exam:  Vitals: Temperature 98.4 pulse 99 respiratory 26 blood pressure 138/51 saturations 100%  Ventilator Settings mode of ventilation is spontaneous off the ventilator on T collar FiO2 28%  . General: Comfortable at this time . Eyes: Grossly normal lids, irises & conjunctiva . ENT: grossly tongue is normal . Neck: no obvious mass . Cardiovascular: S1 S2 normal no gallop . Respiratory: No rhonchi no rales are noted . Abdomen: soft . Skin: no rash seen on limited exam . Musculoskeletal: not rigid . Psychiatric:unable to assess . Neurologic: no seizure no involuntary movements         Lab Data:   Basic Metabolic Panel: Recent Labs  Lab 08/25/18 0712 08/26/18 0731 08/27/18 0851 08/30/18 1050 08/31/18 0822  NA 140 138 139  --  138  K 5.0 3.1* 3.6  --  3.7  CL 101 98 99  --  100  CO2 29 29 29   --  27  GLUCOSE 127* 135* 133*  --  112*  BUN 50* 37* 34*  --  27*  CREATININE 1.06* 1.12* 1.21*  --  1.19*  CALCIUM 7.9* 8.8* 8.9  --  9.1  MG 1.7 2.2 1.9 2.2 1.8  PHOS  --  3.2 3.9  --   --     ABG: No results for input(s): PHART, PCO2ART, PO2ART, HCO3, O2SAT in the last 168 hours.  Liver Function Tests: Recent Labs  Lab 08/26/18 0731 08/27/18 0851  ALBUMIN 1.6* 1.7*   No results for input(s): LIPASE, AMYLASE in the last 168 hours. No results for input(s): AMMONIA in the last 168 hours.  CBC: Recent Labs  Lab 08/26/18 0731 08/31/18 0822  WBC 11.9* 12.5*  HGB 8.4* 8.0*  HCT 24.3*  23.2*  MCV 85.3 85.9  PLT 349 368    Cardiac Enzymes: No results for input(s): CKTOTAL, CKMB, CKMBINDEX, TROPONINI in the last 168 hours.  BNP (last 3 results) No results for input(s): BNP in the last 8760 hours.  ProBNP (last 3 results) No results for input(s): PROBNP in the last 8760 hours.  Radiological Exams: No results found.  Assessment/Plan Active Problems:   Acute on chronic respiratory failure with hypoxia (HCC)   Cardiac arrest (HCC)   COPD with chronic bronchitis (HCC)   Aspiration pneumonia due to gastric secretions (HCC)   Anoxic brain injury ([place])   1. Acute on chronic respiratory failure with hypoxia we will continue to wean as mentioned the patient should surpass 48 hours today continue pulmonary toilet supportive care. 2. Cardiac arrest rhythm is stable we will continue to monitor. 3. COPD with chronic bronchitis at baseline we will continue present management 4. Aspiration pneumonia treated clinically improved 5. Anoxic brain injury grossly unchanged   I have personally seen and evaluated the patient, evaluated laboratory and imaging results, formulated the assessment and plan and placed orders. The Patient requires high complexity decision making for assessment and support.  Case was discussed on Rounds with the Respiratory Therapy Staff  Allyne Gee,  MD [facility] Pulmonary Critical Care Medicine Sleep Medicine

## 2018-09-01 DIAGNOSIS — J9621 Acute and chronic respiratory failure with hypoxia: Secondary | ICD-10-CM | POA: Diagnosis not present

## 2018-09-01 DIAGNOSIS — I469 Cardiac arrest, cause unspecified: Secondary | ICD-10-CM | POA: Diagnosis not present

## 2018-09-01 DIAGNOSIS — G931 Anoxic brain damage, not elsewhere classified: Secondary | ICD-10-CM | POA: Diagnosis not present

## 2018-09-01 DIAGNOSIS — J690 Pneumonitis due to inhalation of food and vomit: Secondary | ICD-10-CM | POA: Diagnosis not present

## 2018-09-01 NOTE — Progress Notes (Addendum)
Pulmonary Critical Care Medicine [place]   PULMONARY CRITICAL CARE SERVICE  PROGRESS NOTE  Date of Service: 09/01/2018  Catherine Pacheco  [id number]:[id number]  DOB: 1950/09/20   DOA: 07/30/2018  Referring Physician: Merton Border, MD  HPI: Catherine Pacheco is a 68 y.o. female seen for follow up of Acute on Chronic Respiratory Failure.  Patient is on aerosol trach collar 28% FiO2 satting well no distress.  Medications: Reviewed on Rounds  Physical Exam:  Vitals: Pulse 80 respirations 20 BP 109/40 O2 sat 99% 99.1  Ventilator Settings 12% ATC  . General: Comfortable at this time . Eyes: Grossly normal lids, irises & conjunctiva . ENT: grossly tongue is normal . Neck: no obvious mass . Cardiovascular: S1 S2 normal no gallop . Respiratory: No respiratory is . Abdomen: soft . Skin: no rash seen on limited exam . Musculoskeletal: not rigid . Psychiatric:unable to assess . Neurologic: no seizure no involuntary movements         Lab Data:   Basic Metabolic Panel: Recent Labs  Lab 08/26/18 0731 08/27/18 0851 08/30/18 1050 08/31/18 0822  NA 138 139  --  138  K 3.1* 3.6  --  3.7  CL 98 99  --  100  CO2 29 29  --  27  GLUCOSE 135* 133*  --  112*  BUN 37* 34*  --  27*  CREATININE 1.12* 1.21*  --  1.19*  CALCIUM 8.8* 8.9  --  9.1  MG 2.2 1.9 2.2 1.8  PHOS 3.2 3.9  --   --     ABG: No results for input(s): PHART, PCO2ART, PO2ART, HCO3, O2SAT in the last 168 hours.  Liver Function Tests: Recent Labs  Lab 08/26/18 0731 08/27/18 0851  ALBUMIN 1.6* 1.7*   No results for input(s): LIPASE, AMYLASE in the last 168 hours. No results for input(s): AMMONIA in the last 168 hours.  CBC: Recent Labs  Lab 08/26/18 0731 08/31/18 0822  WBC 11.9* 12.5*  HGB 8.4* 8.0*  HCT 24.3* 23.2*  MCV 85.3 85.9  PLT 349 368    Cardiac Enzymes: No results for input(s): CKTOTAL, CKMB, CKMBINDEX, TROPONINI in the last 168 hours.  BNP (last 3  results) No results for input(s): BNP in the last 8760 hours.  ProBNP (last 3 results) No results for input(s): PROBNP in the last 8760 hours.  Radiological Exams: No results found.  Assessment/Plan Active Problems:   Acute on chronic respiratory failure with hypoxia (HCC)   Cardiac arrest (HCC)   COPD with chronic bronchitis (HCC)   Aspiration pneumonia due to gastric secretions (HCC)   Anoxic brain injury ([place])   1. Acute on chronic respiratory failure with hypoxia continue aerosol trach collar 20% FiO2.  Continue supportive measures pulmonary toilet. 2. Cardiac arrest rhythm is stable we will continue to monitor. 3. COPD with chronic bronchitis at baseline we will continue present management 4. Aspiration pneumonia treated clinically improved 5. Anoxic brain injury grossly unchanged monitor   I have personally seen and evaluated the patient, evaluated laboratory and imaging results, formulated the assessment and plan and placed orders. The Patient requires high complexity decision making for assessment and support.  Case was discussed on Rounds with the Respiratory Therapy Staff  Allyne Gee, MD [facility] Pulmonary Critical Care Medicine Sleep Medicine

## 2018-09-02 DIAGNOSIS — J690 Pneumonitis due to inhalation of food and vomit: Secondary | ICD-10-CM | POA: Diagnosis not present

## 2018-09-02 DIAGNOSIS — I469 Cardiac arrest, cause unspecified: Secondary | ICD-10-CM | POA: Diagnosis not present

## 2018-09-02 DIAGNOSIS — J9621 Acute and chronic respiratory failure with hypoxia: Secondary | ICD-10-CM | POA: Diagnosis not present

## 2018-09-02 DIAGNOSIS — G931 Anoxic brain damage, not elsewhere classified: Secondary | ICD-10-CM | POA: Diagnosis not present

## 2018-09-02 NOTE — Progress Notes (Addendum)
Pulmonary Critical Care Medicine [place]   PULMONARY CRITICAL CARE SERVICE  PROGRESS NOTE  Date of Service: 09/02/2018  Catherine Pacheco  FXT:[id number]  DOB: 1950/06/29   DOA: 07/30/2018  Referring Physician: Merton Border, MD  HPI: Catherine Pacheco is a 68 y.o. female seen for follow up of Acute on Chronic Respiratory Failure.  Patient continues on aerosol trach collar 28% FiO2 satting well with no acute distress at this time.  Medications: Reviewed on Rounds  Physical Exam:  Vitals: Pulse 72 respirations 24 BP 133/74 O2 sat 100% temp 98.1  Ventilator Settings 28% ATC  . General: Comfortable at this time . Eyes: Grossly normal lids, irises & conjunctiva . ENT: grossly tongue is normal . Neck: no obvious mass . Cardiovascular: S1 S2 normal no gallop . Respiratory: No rales or rhonchi noted . Abdomen: soft . Skin: no rash seen on limited exam . Musculoskeletal: not rigid . Psychiatric:unable to assess . Neurologic: no seizure no involuntary movements         Lab Data:   Basic Metabolic Panel: Recent Labs  Lab 08/27/18 0851 08/30/18 1050 08/31/18 0822  NA 139  --  138  K 3.6  --  3.7  CL 99  --  100  CO2 29  --  27  GLUCOSE 133*  --  112*  BUN 34*  --  27*  CREATININE 1.21*  --  1.19*  CALCIUM 8.9  --  9.1  MG 1.9 2.2 1.8  PHOS 3.9  --   --     ABG: No results for input(s): PHART, PCO2ART, PO2ART, HCO3, O2SAT in the last 168 hours.  Liver Function Tests: Recent Labs  Lab 08/27/18 0851  ALBUMIN 1.7*   No results for input(s): LIPASE, AMYLASE in the last 168 hours. No results for input(s): AMMONIA in the last 168 hours.  CBC: Recent Labs  Lab 08/31/18 0822  WBC 12.5*  HGB 8.0*  HCT 23.2*  MCV 85.9  PLT 368    Cardiac Enzymes: No results for input(s): CKTOTAL, CKMB, CKMBINDEX, TROPONINI in the last 168 hours.  BNP (last 3 results) No results for input(s): BNP in the last 8760 hours.  ProBNP (last 3  results) No results for input(s): PROBNP in the last 8760 hours.  Radiological Exams: No results found.  Assessment/Plan Active Problems:   Acute on chronic respiratory failure with hypoxia (HCC)   Cardiac arrest (HCC)   COPD with chronic bronchitis (HCC)   Aspiration pneumonia due to gastric secretions (HCC)   Anoxic brain injury ([place])   1. Acute on chronic respiratory failure with hypoxia continue aerosol trach collar 28% FiO2.  Continue supportive measures pulmonary toilet. 2. Cardiac arrest rhythm is stable we will continue to monitor. 3. COPD with chronic bronchitis at baseline we will continue present management 4. Aspiration pneumonia treated clinically improved 5. Anoxic brain injury grossly unchanged monitor   I have personally seen and evaluated the patient, evaluated laboratory and imaging results, formulated the assessment and plan and placed orders. The Patient requires high complexity decision making for assessment and support.  Case was discussed on Rounds with the Respiratory Therapy Staff  Allyne Gee, MD [facility] Pulmonary Critical Care Medicine Sleep Medicine

## 2018-09-03 DIAGNOSIS — I469 Cardiac arrest, cause unspecified: Secondary | ICD-10-CM | POA: Diagnosis not present

## 2018-09-03 DIAGNOSIS — G931 Anoxic brain damage, not elsewhere classified: Secondary | ICD-10-CM | POA: Diagnosis not present

## 2018-09-03 DIAGNOSIS — J9621 Acute and chronic respiratory failure with hypoxia: Secondary | ICD-10-CM | POA: Diagnosis not present

## 2018-09-03 DIAGNOSIS — J690 Pneumonitis due to inhalation of food and vomit: Secondary | ICD-10-CM | POA: Diagnosis not present

## 2018-09-03 LAB — MAGNESIUM: Magnesium: 1.6 mg/dL — ABNORMAL LOW (ref 1.7–2.4)

## 2018-09-03 LAB — COMPREHENSIVE METABOLIC PANEL
ALT: 70 U/L — ABNORMAL HIGH (ref 0–44)
AST: 70 U/L — ABNORMAL HIGH (ref 15–41)
Albumin: 1.8 g/dL — ABNORMAL LOW (ref 3.5–5.0)
Alkaline Phosphatase: 227 U/L — ABNORMAL HIGH (ref 38–126)
Anion gap: 11 (ref 5–15)
BUN: 26 mg/dL — ABNORMAL HIGH (ref 8–23)
CO2: 27 mmol/L (ref 22–32)
Calcium: 9.1 mg/dL (ref 8.9–10.3)
Chloride: 99 mmol/L (ref 98–111)
Creatinine, Ser: 1.04 mg/dL — ABNORMAL HIGH (ref 0.44–1.00)
GFR calc Af Amer: >60 mL/min (ref >60)
GFR calc non Af Amer: 56 mL/min — ABNORMAL LOW (ref >60)
Glucose, Bld: 121 mg/dL — ABNORMAL HIGH (ref 70–99)
Potassium: 3.2 mmol/L — ABNORMAL LOW (ref 3.5–5.1)
Sodium: 137 mmol/L (ref 135–145)
Total Bilirubin: 0.5 mg/dL (ref 0.3–1.2)
Total Protein: 5.9 g/dL — ABNORMAL LOW (ref 6.5–8.1)

## 2018-09-03 LAB — CBC
HCT: 23.3 % — ABNORMAL LOW (ref 36.0–46.0)
Hemoglobin: 8.1 g/dL — ABNORMAL LOW (ref 12.0–15.0)
MCH: 29.3 pg (ref 26.0–34.0)
MCHC: 34.8 g/dL (ref 30.0–36.0)
MCV: 84.4 fL (ref 80.0–100.0)
Platelets: 356 10*3/uL (ref 150–400)
RBC: 2.76 MIL/uL — ABNORMAL LOW (ref 3.87–5.11)
RDW: 14.6 % (ref 11.5–15.5)
WBC: 11.5 10*3/uL — ABNORMAL HIGH (ref 4.0–10.5)
nRBC: 0.2 % (ref 0.0–0.2)

## 2018-09-03 NOTE — Progress Notes (Addendum)
Pulmonary Critical Care Medicine [place]   PULMONARY CRITICAL CARE SERVICE  PROGRESS NOTE  Date of Service: 09/03/2018  Catherine Pacheco  [id number]  DOB: Aug 18, 1950   DOA: 07/30/2018  Referring Physician: Merton Border, MD  HPI: Catherine Pacheco is a 68 y.o. female seen for follow up of Acute on Chronic Respiratory Failure.  Patient is on aerosol trach collar 28% FiO2 satting with no fever or distress.  Medications: Reviewed on Rounds  Physical Exam:  Vitals: Pulse 70 respirations 20 BP 134/77 O2 sat 90% temp 96.8  Ventilator Settings T-bar 28% FiO2  . General: Comfortable at this time . Eyes: Grossly normal lids, irises & conjunctiva . ENT: grossly tongue is normal . Neck: no obvious mass . Cardiovascular: S1 S2 normal no gallop . Respiratory: No rales or rhonchi noted . Abdomen: soft . Skin: no rash seen on limited exam . Musculoskeletal: not rigid . Psychiatric:unable to assess . Neurologic: no seizure no involuntary movements         Lab Data:   Basic Metabolic Panel: Recent Labs  Lab 08/30/18 1050 08/31/18 0822 09/03/18 0706  NA  --  138 137  K  --  3.7 3.2*  CL  --  100 99  CO2  --  27 27  GLUCOSE  --  112* 121*  BUN  --  27* 26*  CREATININE  --  1.19* 1.04*  CALCIUM  --  9.1 9.1  MG 2.2 1.8 1.6*    ABG: No results for input(s): PHART, PCO2ART, PO2ART, HCO3, O2SAT in the last 168 hours.  Liver Function Tests: Recent Labs  Lab 09/03/18 0706  AST 70*  ALT 70*  ALKPHOS 227*  BILITOT 0.5  PROT 5.9*  ALBUMIN 1.8*   No results for input(s): LIPASE, AMYLASE in the last 168 hours. No results for input(s): AMMONIA in the last 168 hours.  CBC: Recent Labs  Lab 08/31/18 0822 09/03/18 0706  WBC 12.5* 11.5*  HGB 8.0* 8.1*  HCT 23.2* 23.3*  MCV 85.9 84.4  PLT 368 356    Cardiac Enzymes: No results for input(s): CKTOTAL, CKMB, CKMBINDEX, TROPONINI in the last 168 hours.  BNP (last 3 results) No  results for input(s): BNP in the last 8760 hours.  ProBNP (last 3 results) No results for input(s): PROBNP in the last 8760 hours.  Radiological Exams: No results found.  Assessment/Plan Active Problems:   Acute on chronic respiratory failure with hypoxia (HCC)   Cardiac arrest (HCC)   COPD with chronic bronchitis (HCC)   Aspiration pneumonia due to gastric secretions (HCC)   Anoxic brain injury ([place])   1. Acute on chronic respiratory failure with hypoxiacontinue aerosol trach collar 28% FiO2. Continue supportive measures pulmonary toilet. 2. Cardiac arrest rhythm is stable we will continue to monitor. 3. COPD with chronic bronchitis at baseline we will continue present management 4. Aspiration pneumonia treated clinically improved 5. Anoxic brain injury grossly unchangedmonitor   I have personally seen and evaluated the patient, evaluated laboratory and imaging results, formulated the assessment and plan and placed orders. The Patient requires high complexity decision making for assessment and support.  Case was discussed on Rounds with the Respiratory Therapy Staff  Allyne Gee, MD [facility] Pulmonary Critical Care Medicine Sleep Medicine

## 2018-09-04 DIAGNOSIS — I469 Cardiac arrest, cause unspecified: Secondary | ICD-10-CM | POA: Diagnosis not present

## 2018-09-04 DIAGNOSIS — G931 Anoxic brain damage, not elsewhere classified: Secondary | ICD-10-CM | POA: Diagnosis not present

## 2018-09-04 DIAGNOSIS — J9621 Acute and chronic respiratory failure with hypoxia: Secondary | ICD-10-CM | POA: Diagnosis not present

## 2018-09-04 DIAGNOSIS — J690 Pneumonitis due to inhalation of food and vomit: Secondary | ICD-10-CM | POA: Diagnosis not present

## 2018-09-04 LAB — MAGNESIUM: Magnesium: 1.9 mg/dL (ref 1.7–2.4)

## 2018-09-04 LAB — POTASSIUM: Potassium: 3.9 mmol/L (ref 3.5–5.1)

## 2018-09-04 NOTE — Progress Notes (Signed)
Pulmonary Critical Care Medicine [place]   PULMONARY CRITICAL CARE SERVICE  PROGRESS NOTE  Date of Service: 09/04/2018  Catherine Pacheco  ATF:[id number]  DOB: 12-20-1950   DOA: 07/30/2018  Referring Physician: Merton Border, MD  HPI: Catherine Pacheco is a 68 y.o. female seen for follow up of Acute on Chronic Respiratory Failure.  Patient currently is on T collar has been on 20% FiO2 with excellent saturations  Medications: Reviewed on Rounds  Physical Exam:  Vitals: Temperature 96.0 pulse 82 respiratory 21 blood pressure 114/67 saturations 100%  Ventilator Settings off the ventilator on T collar  . General: Comfortable at this time . Eyes: Grossly normal lids, irises & conjunctiva . ENT: grossly tongue is normal . Neck: no obvious mass . Cardiovascular: S1 S2 normal no gallop . Respiratory: No rhonchi no rales are noted . Abdomen: soft . Skin: no rash seen on limited exam . Musculoskeletal: not rigid . Psychiatric:unable to assess . Neurologic: no seizure no involuntary movements         Lab Data:   Basic Metabolic Panel: Recent Labs  Lab 08/30/18 1050 08/31/18 0822 09/03/18 0706 09/04/18 0844  NA  --  138 137  --   K  --  3.7 3.2* 3.9  CL  --  100 99  --   CO2  --  27 27  --   GLUCOSE  --  112* 121*  --   BUN  --  27* 26*  --   CREATININE  --  1.19* 1.04*  --   CALCIUM  --  9.1 9.1  --   MG 2.2 1.8 1.6* 1.9    ABG: No results for input(s): PHART, PCO2ART, PO2ART, HCO3, O2SAT in the last 168 hours.  Liver Function Tests: Recent Labs  Lab 09/03/18 0706  AST 70*  ALT 70*  ALKPHOS 227*  BILITOT 0.5  PROT 5.9*  ALBUMIN 1.8*   No results for input(s): LIPASE, AMYLASE in the last 168 hours. No results for input(s): AMMONIA in the last 168 hours.  CBC: Recent Labs  Lab 08/31/18 0822 09/03/18 0706  WBC 12.5* 11.5*  HGB 8.0* 8.1*  HCT 23.2* 23.3*  MCV 85.9 84.4  PLT 368 356    Cardiac Enzymes: No results for  input(s): CKTOTAL, CKMB, CKMBINDEX, TROPONINI in the last 168 hours.  BNP (last 3 results) No results for input(s): BNP in the last 8760 hours.  ProBNP (last 3 results) No results for input(s): PROBNP in the last 8760 hours.  Radiological Exams: No results found.  Assessment/Plan Active Problems:   Acute on chronic respiratory failure with hypoxia (HCC)   Cardiac arrest (HCC)   COPD with chronic bronchitis (HCC)   Aspiration pneumonia due to gastric secretions (HCC)   Anoxic brain injury ([place])   1. Acute on chronic respiratory failure with hypoxia we will continue with T collar trials titrate oxygen continue pulmonary toilet. 2. Cardiac arrest rhythm is stable at this time 3. COPD advanced disease we will continue with supportive care 4. Aspiration pneumonia treated follow radiologically 5. Acute on anoxic encephalopathy we will continue with supportive cares appears to be unchanged   I have personally seen and evaluated the patient, evaluated laboratory and imaging results, formulated the assessment and plan and placed orders. The Patient requires high complexity decision making for assessment and support.  Case was discussed on Rounds with the Respiratory Therapy Staff  Allyne Gee, MD [facility] Pulmonary Critical Care Medicine Sleep Medicine

## 2018-09-05 DIAGNOSIS — G931 Anoxic brain damage, not elsewhere classified: Secondary | ICD-10-CM | POA: Diagnosis not present

## 2018-09-05 DIAGNOSIS — I469 Cardiac arrest, cause unspecified: Secondary | ICD-10-CM | POA: Diagnosis not present

## 2018-09-05 DIAGNOSIS — J690 Pneumonitis due to inhalation of food and vomit: Secondary | ICD-10-CM | POA: Diagnosis not present

## 2018-09-05 DIAGNOSIS — J9621 Acute and chronic respiratory failure with hypoxia: Secondary | ICD-10-CM | POA: Diagnosis not present

## 2018-09-05 NOTE — Progress Notes (Addendum)
Pulmonary Critical Care Medicine [place]   PULMONARY CRITICAL CARE SERVICE  PROGRESS NOTE  Date of Service: 09/05/2018  Catherine Pacheco  JYN:[id number]  DOB: [birth year]   DOA: 07/30/2018  Referring Physician: Merton Border, MD  HPI: Catherine Pacheco is a 68 y.o. female seen for follow up of Acute on Chronic Respiratory Failure.  Patient is on aerosol trach collar 28% FiO2 satting well no fever or distress noted.  Medications: Reviewed on Rounds  Physical Exam:  Vitals: Pulse 79 respirations 20 BP 135/80 O2 sat 90% temp 98.4  Ventilator Settings 28% ATC  . General: Comfortable at this time . Eyes: Grossly normal lids, irises & conjunctiva . ENT: grossly tongue is normal . Neck: no obvious mass . Cardiovascular: S1 S2 normal no gallop . Respiratory: No rales or rhonchi noted . Abdomen: soft . Skin: no rash seen on limited exam . Musculoskeletal: not rigid . Psychiatric:unable to assess . Neurologic: no seizure no involuntary movements         Lab Data:   Basic Metabolic Panel: Recent Labs  Lab 08/30/18 1050 08/31/18 0822 09/03/18 0706 09/04/18 0844  NA  --  138 137  --   K  --  3.7 3.2* 3.9  CL  --  100 99  --   CO2  --  27 27  --   GLUCOSE  --  112* 121*  --   BUN  --  27* 26*  --   CREATININE  --  1.19* 1.04*  --   CALCIUM  --  9.1 9.1  --   MG 2.2 1.8 1.6* 1.9    ABG: No results for input(s): PHART, PCO2ART, PO2ART, HCO3, O2SAT in the last 168 hours.  Liver Function Tests: Recent Labs  Lab 09/03/18 0706  AST 70*  ALT 70*  ALKPHOS 227*  BILITOT 0.5  PROT 5.9*  ALBUMIN 1.8*   No results for input(s): LIPASE, AMYLASE in the last 168 hours. No results for input(s): AMMONIA in the last 168 hours.  CBC: Recent Labs  Lab 08/31/18 0822 09/03/18 0706  WBC 12.5* 11.5*  HGB 8.0* 8.1*  HCT 23.2* 23.3*  MCV 85.9 84.4  PLT 368 356    Cardiac Enzymes: No results for input(s): CKTOTAL, CKMB, CKMBINDEX, TROPONINI  in the last 168 hours.  BNP (last 3 results) No results for input(s): BNP in the last 8760 hours.  ProBNP (last 3 results) No results for input(s): PROBNP in the last 8760 hours.  Radiological Exams: No results found.  Assessment/Plan Active Problems:   Acute on chronic respiratory failure with hypoxia (HCC)   Cardiac arrest (HCC)   COPD with chronic bronchitis (HCC)   Aspiration pneumonia due to gastric secretions (HCC)   Anoxic brain injury ([place])   1. Acute on chronic respiratory failure with hypoxia we will continue with T collar trials titrate oxygen continue pulmonary toilet. 2. Cardiac arrest rhythm is stable at this time 3. COPD advanced disease we will continue with supportive care 4. Aspiration pneumonia treated follow radiologically 5. Acute on anoxic encephalopathy we will continue with supportive cares appears to be unchanged   I have personally seen and evaluated the patient, evaluated laboratory and imaging results, formulated the assessment and plan and placed orders. The Patient requires high complexity decision making for assessment and support.  Case was discussed on Rounds with the Respiratory Therapy Staff  Allyne Gee, MD [facility] Pulmonary Critical Care Medicine Sleep Medicine

## 2018-09-06 DIAGNOSIS — I469 Cardiac arrest, cause unspecified: Secondary | ICD-10-CM | POA: Diagnosis not present

## 2018-09-06 DIAGNOSIS — G931 Anoxic brain damage, not elsewhere classified: Secondary | ICD-10-CM | POA: Diagnosis not present

## 2018-09-06 DIAGNOSIS — J690 Pneumonitis due to inhalation of food and vomit: Secondary | ICD-10-CM | POA: Diagnosis not present

## 2018-09-06 DIAGNOSIS — J9621 Acute and chronic respiratory failure with hypoxia: Secondary | ICD-10-CM | POA: Diagnosis not present

## 2018-09-06 NOTE — Progress Notes (Addendum)
Pulmonary Critical Care Medicine [place]   PULMONARY CRITICAL CARE SERVICE  PROGRESS NOTE  Date of Service: 09/06/2018  Catherine Pacheco  UVO:[id number]  DOB: 1950/11/24   DOA: 07/30/2018  Referring Physician: Merton Border, MD  HPI: Catherine Pacheco is a 68 y.o. female seen for follow up of Acute on Chronic Respiratory Failure.  Patient is on aerosol trach collar 20% FiO2 satting well no fever or distress noted.  Medications: Reviewed on Rounds  Physical Exam:  Vitals: Pulse 71 respirations 20 BP 139/79 O2 sat 100% temp 97.3  Ventilator Settings aerosol trach collar 28%  . General: Comfortable at this time . Eyes: Grossly normal lids, irises & conjunctiva . ENT: grossly tongue is normal . Neck: no obvious mass . Cardiovascular: S1 S2 normal no gallop . Respiratory: No rales or rhonchi noted . Abdomen: soft . Skin: no rash seen on limited exam . Musculoskeletal: not rigid . Psychiatric:unable to assess . Neurologic: no seizure no involuntary movements         Lab Data:   Basic Metabolic Panel: Recent Labs  Lab 08/31/18 0822 09/03/18 0706 09/04/18 0844  NA 138 137  --   K 3.7 3.2* 3.9  CL 100 99  --   CO2 27 27  --   GLUCOSE 112* 121*  --   BUN 27* 26*  --   CREATININE 1.19* 1.04*  --   CALCIUM 9.1 9.1  --   MG 1.8 1.6* 1.9    ABG: No results for input(s): PHART, PCO2ART, PO2ART, HCO3, O2SAT in the last 168 hours.  Liver Function Tests: Recent Labs  Lab 09/03/18 0706  AST 70*  ALT 70*  ALKPHOS 227*  BILITOT 0.5  PROT 5.9*  ALBUMIN 1.8*   No results for input(s): LIPASE, AMYLASE in the last 168 hours. No results for input(s): AMMONIA in the last 168 hours.  CBC: Recent Labs  Lab 08/31/18 0822 09/03/18 0706  WBC 12.5* 11.5*  HGB 8.0* 8.1*  HCT 23.2* 23.3*  MCV 85.9 84.4  PLT 368 356    Cardiac Enzymes: No results for input(s): CKTOTAL, CKMB, CKMBINDEX, TROPONINI in the last 168 hours.  BNP (last 3  results) No results for input(s): BNP in the last 8760 hours.  ProBNP (last 3 results) No results for input(s): PROBNP in the last 8760 hours.  Radiological Exams: No results found.  Assessment/Plan Active Problems:   Acute on chronic respiratory failure with hypoxia (HCC)   Cardiac arrest (HCC)   COPD with chronic bronchitis (HCC)   Aspiration pneumonia due to gastric secretions (HCC)   Anoxic brain injury ([place])   1. Acute on chronic respiratory failure with hypoxia we will continue with T collar trials titrate oxygen continue pulmonary toilet. 2. Cardiac arrest rhythm is stable at this time 3. COPD advanced disease we will continue with supportive care 4. Aspiration pneumonia treated follow radiologically 5. Acute on anoxic encephalopathy we will continue with supportive cares appears to be unchanged   I have personally seen and evaluated the patient, evaluated laboratory and imaging results, formulated the assessment and plan and placed orders. The Patient requires high complexity decision making for assessment and support.  Case was discussed on Rounds with the Respiratory Therapy Staff  Allyne Gee, MD [facility] Pulmonary Critical Care Medicine Sleep Medicine

## 2018-09-07 DIAGNOSIS — G931 Anoxic brain damage, not elsewhere classified: Secondary | ICD-10-CM | POA: Diagnosis not present

## 2018-09-07 DIAGNOSIS — J9621 Acute and chronic respiratory failure with hypoxia: Secondary | ICD-10-CM | POA: Diagnosis not present

## 2018-09-07 DIAGNOSIS — J690 Pneumonitis due to inhalation of food and vomit: Secondary | ICD-10-CM | POA: Diagnosis not present

## 2018-09-07 DIAGNOSIS — I469 Cardiac arrest, cause unspecified: Secondary | ICD-10-CM | POA: Diagnosis not present

## 2018-09-07 LAB — COMPREHENSIVE METABOLIC PANEL
ALT: 77 U/L — ABNORMAL HIGH (ref 0–44)
AST: 74 U/L — ABNORMAL HIGH (ref 15–41)
Albumin: 1.8 g/dL — ABNORMAL LOW (ref 3.5–5.0)
Alkaline Phosphatase: 214 U/L — ABNORMAL HIGH (ref 38–126)
Anion gap: 10 (ref 5–15)
BUN: 28 mg/dL — ABNORMAL HIGH (ref 8–23)
CO2: 26 mmol/L (ref 22–32)
Calcium: 9 mg/dL (ref 8.9–10.3)
Chloride: 99 mmol/L (ref 98–111)
Creatinine, Ser: 1.04 mg/dL — ABNORMAL HIGH (ref 0.44–1.00)
GFR calc Af Amer: >60 mL/min (ref >60)
GFR calc non Af Amer: 56 mL/min — ABNORMAL LOW (ref >60)
Glucose, Bld: 131 mg/dL — ABNORMAL HIGH (ref 70–99)
Potassium: 3.7 mmol/L (ref 3.5–5.1)
Sodium: 135 mmol/L (ref 135–145)
Total Bilirubin: 0.5 mg/dL (ref 0.3–1.2)
Total Protein: 6.1 g/dL — ABNORMAL LOW (ref 6.5–8.1)

## 2018-09-07 LAB — MAGNESIUM: Magnesium: 1.8 mg/dL (ref 1.7–2.4)

## 2018-09-07 LAB — CBC
HCT: 21.7 % — ABNORMAL LOW (ref 36.0–46.0)
Hemoglobin: 7.6 g/dL — ABNORMAL LOW (ref 12.0–15.0)
MCH: 29.7 pg (ref 26.0–34.0)
MCHC: 35 g/dL (ref 30.0–36.0)
MCV: 84.8 fL (ref 80.0–100.0)
Platelets: 346 10*3/uL (ref 150–400)
RBC: 2.56 MIL/uL — ABNORMAL LOW (ref 3.87–5.11)
RDW: 14.6 % (ref 11.5–15.5)
WBC: 11.6 10*3/uL — ABNORMAL HIGH (ref 4.0–10.5)
nRBC: 0.2 % (ref 0.0–0.2)

## 2018-09-07 NOTE — Progress Notes (Signed)
Pulmonary Critical Care Medicine [place]   PULMONARY CRITICAL CARE SERVICE  PROGRESS NOTE  Date of Service: 09/07/2018  Chevi Lim  ION:[id number]  DOB: [birth year]   DOA: 07/30/2018  Referring Physician: Merton Border, MD  HPI: Catherine Pacheco is a 68 y.o. female seen for follow up of Acute on Chronic Respiratory Failure.  Patient is on T collar right now comfortable without distress has been on 28% FiO2 grossly remains unchanged  Medications: Reviewed on Rounds  Physical Exam:  Vitals: Temperature 98.3 pulse 71 respiratory rate 18 blood pressure 145/73 saturations 100%  Ventilator Settings currently is on T collar FiO2 28%  . General: Comfortable at this time . Eyes: Grossly normal lids, irises & conjunctiva . ENT: grossly tongue is normal . Neck: no obvious mass . Cardiovascular: S1 S2 normal no gallop . Respiratory: No rhonchi no rales are noted at this time . Abdomen: soft . Skin: no rash seen on limited exam . Musculoskeletal: not rigid . Psychiatric:unable to assess . Neurologic: no seizure no involuntary movements         Lab Data:   Basic Metabolic Panel: Recent Labs  Lab 09/03/18 0706 09/04/18 0844 09/07/18 0730  NA 137  --  135  K 3.2* 3.9 3.7  CL 99  --  99  CO2 27  --  26  GLUCOSE 121*  --  131*  BUN 26*  --  28*  CREATININE 1.04*  --  1.04*  CALCIUM 9.1  --  9.0  MG 1.6* 1.9 1.8    ABG: No results for input(s): PHART, PCO2ART, PO2ART, HCO3, O2SAT in the last 168 hours.  Liver Function Tests: Recent Labs  Lab 09/03/18 0706 09/07/18 0730  AST 70* 74*  ALT 70* 77*  ALKPHOS 227* 214*  BILITOT 0.5 0.5  PROT 5.9* 6.1*  ALBUMIN 1.8* 1.8*   No results for input(s): LIPASE, AMYLASE in the last 168 hours. No results for input(s): AMMONIA in the last 168 hours.  CBC: Recent Labs  Lab 09/03/18 0706 09/07/18 0730  WBC 11.5* 11.6*  HGB 8.1* 7.6*  HCT 23.3* 21.7*  MCV 84.4 84.8  PLT 356 346     Cardiac Enzymes: No results for input(s): CKTOTAL, CKMB, CKMBINDEX, TROPONINI in the last 168 hours.  BNP (last 3 results) No results for input(s): BNP in the last 8760 hours.  ProBNP (last 3 results) No results for input(s): PROBNP in the last 8760 hours.  Radiological Exams: No results found.  Assessment/Plan Active Problems:   Acute on chronic respiratory failure with hypoxia (HCC)   Cardiac arrest (HCC)   COPD with chronic bronchitis (HCC)   Aspiration pneumonia due to gastric secretions (HCC)   Anoxic brain injury ([place])   1. Acute on chronic respiratory failure with hypoxia we will continue with T collar trials continue secretion management pulmonary toilet. 2. Cardiac arrest rhythm is stable at this time we will continue to follow 3. COPD chronic bronchitis at baseline 4. Aspiration pneumonia treated we will continue to follow 5. Anoxic brain injury is grossly unchanged   I have personally seen and evaluated the patient, evaluated laboratory and imaging results, formulated the assessment and plan and placed orders. The Patient requires high complexity decision making for assessment and support.  Case was discussed on Rounds with the Respiratory Therapy Staff  Allyne Gee, MD [facility] Pulmonary Critical Care Medicine Sleep Medicine

## 2018-09-08 DIAGNOSIS — J9621 Acute and chronic respiratory failure with hypoxia: Secondary | ICD-10-CM | POA: Diagnosis not present

## 2018-09-08 DIAGNOSIS — J690 Pneumonitis due to inhalation of food and vomit: Secondary | ICD-10-CM | POA: Diagnosis not present

## 2018-09-08 DIAGNOSIS — G931 Anoxic brain damage, not elsewhere classified: Secondary | ICD-10-CM | POA: Diagnosis not present

## 2018-09-08 DIAGNOSIS — I469 Cardiac arrest, cause unspecified: Secondary | ICD-10-CM | POA: Diagnosis not present

## 2018-09-08 LAB — CBC
HCT: 21.8 % — ABNORMAL LOW (ref 36.0–46.0)
Hemoglobin: 7.5 g/dL — ABNORMAL LOW (ref 12.0–15.0)
MCH: 29.3 pg (ref 26.0–34.0)
MCHC: 34.4 g/dL (ref 30.0–36.0)
MCV: 85.2 fL (ref 80.0–100.0)
Platelets: 350 10*3/uL (ref 150–400)
RBC: 2.56 MIL/uL — ABNORMAL LOW (ref 3.87–5.11)
RDW: 14.6 % (ref 11.5–15.5)
WBC: 9.6 10*3/uL (ref 4.0–10.5)
nRBC: 0 % (ref 0.0–0.2)

## 2018-09-08 LAB — BASIC METABOLIC PANEL
Anion gap: 11 (ref 5–15)
BUN: 31 mg/dL — ABNORMAL HIGH (ref 8–23)
CO2: 27 mmol/L (ref 22–32)
Calcium: 9.1 mg/dL (ref 8.9–10.3)
Chloride: 98 mmol/L (ref 98–111)
Creatinine, Ser: 1.1 mg/dL — ABNORMAL HIGH (ref 0.44–1.00)
GFR calc Af Amer: >60 mL/min (ref >60)
GFR calc non Af Amer: 52 mL/min — ABNORMAL LOW (ref >60)
Glucose, Bld: 112 mg/dL — ABNORMAL HIGH (ref 70–99)
Potassium: 3.6 mmol/L (ref 3.5–5.1)
Sodium: 136 mmol/L (ref 135–145)

## 2018-09-08 LAB — MAGNESIUM: Magnesium: 2 mg/dL (ref 1.7–2.4)

## 2018-09-08 NOTE — Progress Notes (Addendum)
Pulmonary Critical Care Medicine [place]   PULMONARY CRITICAL CARE SERVICE  PROGRESS NOTE  Date of Service: 09/08/2018  Tayelor Osborne  VFI:[id number]  DOB: 22-Jun-1950   DOA: 07/30/2018  Referring Physician: Merton Border, MD  HPI: Glena Pharris is a 68 y.o. female seen for follow up of Acute on Chronic Respiratory Failure.  Patient is on T-bar 20% FiO2 satting well with no fever or distress.  Medications: Reviewed on Rounds  Physical Exam:  Vitals: Pulse 74 respirations 20 BP 135/69 O2 sat 100% temp 97.6  Ventilator Settings T-bar 28%  . General: Comfortable at this time . Eyes: Grossly normal lids, irises & conjunctiva . ENT: grossly tongue is normal . Neck: no obvious mass . Cardiovascular: S1 S2 normal no gallop . Respiratory: No rales or rhonchi noted . Abdomen: soft . Skin: no rash seen on limited exam . Musculoskeletal: not rigid . Psychiatric:unable to assess . Neurologic: no seizure no involuntary movements         Lab Data:   Basic Metabolic Panel: Recent Labs  Lab 09/03/18 0706 09/04/18 0844 09/07/18 0730 09/08/18 0553  NA 137  --  135 136  K 3.2* 3.9 3.7 3.6  CL 99  --  99 98  CO2 27  --  26 27  GLUCOSE 121*  --  131* 112*  BUN 26*  --  28* 31*  CREATININE 1.04*  --  1.04* 1.10*  CALCIUM 9.1  --  9.0 9.1  MG 1.6* 1.9 1.8 2.0    ABG: No results for input(s): PHART, PCO2ART, PO2ART, HCO3, O2SAT in the last 168 hours.  Liver Function Tests: Recent Labs  Lab 09/03/18 0706 09/07/18 0730  AST 70* 74*  ALT 70* 77*  ALKPHOS 227* 214*  BILITOT 0.5 0.5  PROT 5.9* 6.1*  ALBUMIN 1.8* 1.8*   No results for input(s): LIPASE, AMYLASE in the last 168 hours. No results for input(s): AMMONIA in the last 168 hours.  CBC: Recent Labs  Lab 09/03/18 0706 09/07/18 0730 09/08/18 0553  WBC 11.5* 11.6* 9.6  HGB 8.1* 7.6* 7.5*  HCT 23.3* 21.7* 21.8*  MCV 84.4 84.8 85.2  PLT 356 346 350    Cardiac  Enzymes: No results for input(s): CKTOTAL, CKMB, CKMBINDEX, TROPONINI in the last 168 hours.  BNP (last 3 results) No results for input(s): BNP in the last 8760 hours.  ProBNP (last 3 results) No results for input(s): PROBNP in the last 8760 hours.  Radiological Exams: No results found.  Assessment/Plan Active Problems:   Acute on chronic respiratory failure with hypoxia (HCC)   Cardiac arrest (HCC)   COPD with chronic bronchitis (HCC)   Aspiration pneumonia due to gastric secretions (HCC)   Anoxic brain injury ([place])   1. Acute on chronic respiratory failure with hypoxia we will continue with T collar trials continue secretion management pulmonary toilet. 2. Cardiac arrest rhythm is stable at this time we will continue to follow 3. COPD chronic bronchitis at baseline 4. Aspiration pneumonia treated we will continue to follow 5. Anoxic brain injury is grossly unchanged   I have personally seen and evaluated the patient, evaluated laboratory and imaging results, formulated the assessment and plan and placed orders. The Patient requires high complexity decision making for assessment and support.  Case was discussed on Rounds with the Respiratory Therapy Staff  Allyne Gee, MD [facility] Pulmonary Critical Care Medicine Sleep Medicine

## 2018-09-09 DIAGNOSIS — J690 Pneumonitis due to inhalation of food and vomit: Secondary | ICD-10-CM | POA: Diagnosis not present

## 2018-09-09 DIAGNOSIS — I469 Cardiac arrest, cause unspecified: Secondary | ICD-10-CM | POA: Diagnosis not present

## 2018-09-09 DIAGNOSIS — J9621 Acute and chronic respiratory failure with hypoxia: Secondary | ICD-10-CM | POA: Diagnosis not present

## 2018-09-09 DIAGNOSIS — G931 Anoxic brain damage, not elsewhere classified: Secondary | ICD-10-CM | POA: Diagnosis not present

## 2018-09-09 NOTE — Progress Notes (Addendum)
Pulmonary Critical Care Medicine [place]   PULMONARY CRITICAL CARE SERVICE  PROGRESS NOTE  Date of Service: 09/09/2018  Catherine Pacheco  [id number]  DOB: Jul 30, 1950   DOA: 07/30/2018  Referring Physician: Merton Border, MD  HPI: Catherine Pacheco is a 68 y.o. female seen for follow up of Acute on Chronic Respiratory Failure.  Patient is on T-bar 20% FiO2 satting well with no fever or distress noted.  Medications: Reviewed on Rounds  Physical Exam:  Vitals: Pulse 70 respirations 22 BP 149/67 O2 sat 98% temp 97.0  Ventilator Settings ATC 28%  . General: Comfortable at this time . Eyes: Grossly normal lids, irises & conjunctiva . ENT: grossly tongue is normal . Neck: no obvious mass . Cardiovascular: S1 S2 normal no gallop . Respiratory: No rales or rhonchi noted . Abdomen: soft . Skin: no rash seen on limited exam . Musculoskeletal: not rigid . Psychiatric:unable to assess . Neurologic: no seizure no involuntary movements         Lab Data:   Basic Metabolic Panel: Recent Labs  Lab 09/03/18 0706 09/04/18 0844 09/07/18 0730 09/08/18 0553  NA 137  --  135 136  K 3.2* 3.9 3.7 3.6  CL 99  --  99 98  CO2 27  --  26 27  GLUCOSE 121*  --  131* 112*  BUN 26*  --  28* 31*  CREATININE 1.04*  --  1.04* 1.10*  CALCIUM 9.1  --  9.0 9.1  MG 1.6* 1.9 1.8 2.0    ABG: No results for input(s): PHART, PCO2ART, PO2ART, HCO3, O2SAT in the last 168 hours.  Liver Function Tests: Recent Labs  Lab 09/03/18 0706 09/07/18 0730  AST 70* 74*  ALT 70* 77*  ALKPHOS 227* 214*  BILITOT 0.5 0.5  PROT 5.9* 6.1*  ALBUMIN 1.8* 1.8*   No results for input(s): LIPASE, AMYLASE in the last 168 hours. No results for input(s): AMMONIA in the last 168 hours.  CBC: Recent Labs  Lab 09/03/18 0706 09/07/18 0730 09/08/18 0553  WBC 11.5* 11.6* 9.6  HGB 8.1* 7.6* 7.5*  HCT 23.3* 21.7* 21.8*  MCV 84.4 84.8 85.2  PLT 356 346 350    Cardiac  Enzymes: No results for input(s): CKTOTAL, CKMB, CKMBINDEX, TROPONINI in the last 168 hours.  BNP (last 3 results) No results for input(s): BNP in the last 8760 hours.  ProBNP (last 3 results) No results for input(s): PROBNP in the last 8760 hours.  Radiological Exams: No results found.  Assessment/Plan Active Problems:   Acute on chronic respiratory failure with hypoxia (HCC)   Cardiac arrest (HCC)   COPD with chronic bronchitis (HCC)   Aspiration pneumonia due to gastric secretions (HCC)   Anoxic brain injury ([place])   1. Acute on chronic respiratory failure with hypoxia we will continue with T collar trials continue secretion management pulmonary toilet. 2. Cardiac arrest rhythm is stable at this time we will continue to follow 3. COPD chronic bronchitis at baseline 4. Aspiration pneumonia treated we will continue to follow 5. Anoxic brain injury is grossly unchanged   I have personally seen and evaluated the patient, evaluated laboratory and imaging results, formulated the assessment and plan and placed orders. The Patient requires high complexity decision making for assessment and support.  Case was discussed on Rounds with the Respiratory Therapy Staff  Allyne Gee, MD [facility] Pulmonary Critical Care Medicine Sleep Medicine

## 2018-09-10 DIAGNOSIS — I469 Cardiac arrest, cause unspecified: Secondary | ICD-10-CM | POA: Diagnosis not present

## 2018-09-10 DIAGNOSIS — G931 Anoxic brain damage, not elsewhere classified: Secondary | ICD-10-CM | POA: Diagnosis not present

## 2018-09-10 DIAGNOSIS — J690 Pneumonitis due to inhalation of food and vomit: Secondary | ICD-10-CM | POA: Diagnosis not present

## 2018-09-10 DIAGNOSIS — J9621 Acute and chronic respiratory failure with hypoxia: Secondary | ICD-10-CM | POA: Diagnosis not present

## 2018-09-10 LAB — CBC
HCT: 22.3 % — ABNORMAL LOW (ref 36.0–46.0)
Hemoglobin: 7.7 g/dL — ABNORMAL LOW (ref 12.0–15.0)
MCH: 28.9 pg (ref 26.0–34.0)
MCHC: 34.5 g/dL (ref 30.0–36.0)
MCV: 83.8 fL (ref 80.0–100.0)
Platelets: 401 10*3/uL — ABNORMAL HIGH (ref 150–400)
RBC: 2.66 MIL/uL — ABNORMAL LOW (ref 3.87–5.11)
RDW: 14.6 % (ref 11.5–15.5)
WBC: 11 10*3/uL — ABNORMAL HIGH (ref 4.0–10.5)
nRBC: 0.3 % — ABNORMAL HIGH (ref 0.0–0.2)

## 2018-09-10 LAB — RENAL FUNCTION PANEL
Albumin: 1.9 g/dL — ABNORMAL LOW (ref 3.5–5.0)
Anion gap: 11 (ref 5–15)
BUN: 31 mg/dL — ABNORMAL HIGH (ref 8–23)
CO2: 25 mmol/L (ref 22–32)
Calcium: 9 mg/dL (ref 8.9–10.3)
Chloride: 98 mmol/L (ref 98–111)
Creatinine, Ser: 1.16 mg/dL — ABNORMAL HIGH (ref 0.44–1.00)
GFR calc Af Amer: 57 mL/min — ABNORMAL LOW (ref >60)
GFR calc non Af Amer: 49 mL/min — ABNORMAL LOW (ref >60)
Glucose, Bld: 107 mg/dL — ABNORMAL HIGH (ref 70–99)
Phosphorus: 3.9 mg/dL (ref 2.5–4.6)
Potassium: 3.8 mmol/L (ref 3.5–5.1)
Sodium: 134 mmol/L — ABNORMAL LOW (ref 135–145)

## 2018-09-10 LAB — MAGNESIUM: Magnesium: 1.8 mg/dL (ref 1.7–2.4)

## 2018-09-10 NOTE — Progress Notes (Signed)
Pulmonary Critical Care Medicine [place]   PULMONARY CRITICAL CARE SERVICE  PROGRESS NOTE  Date of Service: 09/10/2018  Catherine Pacheco  UYQ:[id number]  DOB: 12-Feb-1951   DOA: 07/30/2018  Referring Physician: Merton Border, MD  HPI: Catherine Pacheco is a 68 y.o. female seen for follow up of Acute on Chronic Respiratory Failure.  Patient currently is on T collar is on 28% oxygen good saturations are noted  Medications: Reviewed on Rounds  Physical Exam:  Vitals: Temperature 98.8 pulse 67 respiratory rate 22 blood pressure 137/77 saturations 100%  Ventilator Settings off the ventilator on T collar  . General: Comfortable at this time . Eyes: Grossly normal lids, irises & conjunctiva . ENT: grossly tongue is normal . Neck: no obvious mass . Cardiovascular: S1 S2 normal no gallop . Respiratory: No rhonchi no rales are noted . Abdomen: soft . Skin: no rash seen on limited exam . Musculoskeletal: not rigid . Psychiatric:unable to assess . Neurologic: no seizure no involuntary movements         Lab Data:   Basic Metabolic Panel: Recent Labs  Lab 09/04/18 0844 09/07/18 0730 09/08/18 0553 09/10/18 0623  NA  --  135 136 134*  K 3.9 3.7 3.6 3.8  CL  --  99 98 98  CO2  --  26 27 25   GLUCOSE  --  131* 112* 107*  BUN  --  28* 31* 31*  CREATININE  --  1.04* 1.10* 1.16*  CALCIUM  --  9.0 9.1 9.0  MG 1.9 1.8 2.0 1.8  PHOS  --   --   --  3.9    ABG: No results for input(s): PHART, PCO2ART, PO2ART, HCO3, O2SAT in the last 168 hours.  Liver Function Tests: Recent Labs  Lab 09/07/18 0730 09/10/18 0623  AST 74*  --   ALT 77*  --   ALKPHOS 214*  --   BILITOT 0.5  --   PROT 6.1*  --   ALBUMIN 1.8* 1.9*   No results for input(s): LIPASE, AMYLASE in the last 168 hours. No results for input(s): AMMONIA in the last 168 hours.  CBC: Recent Labs  Lab 09/07/18 0730 09/08/18 0553 09/10/18 0623  WBC 11.6* 9.6 11.0*  HGB 7.6* 7.5* 7.7*   HCT 21.7* 21.8* 22.3*  MCV 84.8 85.2 83.8  PLT 346 350 401*    Cardiac Enzymes: No results for input(s): CKTOTAL, CKMB, CKMBINDEX, TROPONINI in the last 168 hours.  BNP (last 3 results) No results for input(s): BNP in the last 8760 hours.  ProBNP (last 3 results) No results for input(s): PROBNP in the last 8760 hours.  Radiological Exams: No results found.  Assessment/Plan Active Problems:   Acute on chronic respiratory failure with hypoxia (HCC)   Cardiac arrest (HCC)   COPD with chronic bronchitis (HCC)   Aspiration pneumonia due to gastric secretions (HCC)   Anoxic brain injury ([place])   1. Acute on chronic respiratory failure with hypoxia we will continue with T collar trials titrate oxygen as tolerated. 2. Cardiac arrest rhythm is been stable 3. COPD at baseline continue present management 4. Aspiration pneumonia treated follow radiologically 5. Anoxic brain injury grossly unchanged   I have personally seen and evaluated the patient, evaluated laboratory and imaging results, formulated the assessment and plan and placed orders. The Patient requires high complexity decision making for assessment and support.  Case was discussed on Rounds with the Respiratory Therapy Staff  Allyne Gee, MD [facility] Pulmonary  Critical Care Medicine Sleep Medicine

## 2018-09-11 DIAGNOSIS — J690 Pneumonitis due to inhalation of food and vomit: Secondary | ICD-10-CM | POA: Diagnosis not present

## 2018-09-11 DIAGNOSIS — I469 Cardiac arrest, cause unspecified: Secondary | ICD-10-CM | POA: Diagnosis not present

## 2018-09-11 DIAGNOSIS — J9621 Acute and chronic respiratory failure with hypoxia: Secondary | ICD-10-CM | POA: Diagnosis not present

## 2018-09-11 DIAGNOSIS — G931 Anoxic brain damage, not elsewhere classified: Secondary | ICD-10-CM | POA: Diagnosis not present

## 2018-09-11 NOTE — Progress Notes (Addendum)
Pulmonary Critical Care Medicine [place]   PULMONARY CRITICAL CARE SERVICE  PROGRESS NOTE  Date of Service: 09/11/2018  Catherine Pacheco  [id number]  DOB: February 17, 1951   DOA: 07/30/2018  Referring Physician: Merton Border, MD  HPI: Catherine Pacheco is a 68 y.o. female seen for follow up of Acute on Chronic Respiratory Failure.  Patient remains on T-bar 20% FiO2 with a large amount of secretions noted at this time.  No distress at this time.  Medications: Reviewed on Rounds  Physical Exam:  Vitals: 98 respirations 14 BP 123/72 O2 sat 98% temp 98.0  Ventilator Settings T-bar 28%  . General: Comfortable at this time . Eyes: Grossly normal lids, irises & conjunctiva . ENT: grossly tongue is normal . Neck: no obvious mass . Cardiovascular: S1 S2 normal no gallop . Respiratory: No rales or rhonchi noted . Abdomen: soft . Skin: no rash seen on limited exam . Musculoskeletal: not rigid . Psychiatric:unable to assess . Neurologic: no seizure no involuntary movements         Lab Data:   Basic Metabolic Panel: Recent Labs  Lab 09/07/18 0730 09/08/18 0553 09/10/18 0623  NA 135 136 134*  K 3.7 3.6 3.8  CL 99 98 98  CO2 26 27 25   GLUCOSE 131* 112* 107*  BUN 28* 31* 31*  CREATININE 1.04* 1.10* 1.16*  CALCIUM 9.0 9.1 9.0  MG 1.8 2.0 1.8  PHOS  --   --  3.9    ABG: No results for input(s): PHART, PCO2ART, PO2ART, HCO3, O2SAT in the last 168 hours.  Liver Function Tests: Recent Labs  Lab 09/07/18 0730 09/10/18 0623  AST 74*  --   ALT 77*  --   ALKPHOS 214*  --   BILITOT 0.5  --   PROT 6.1*  --   ALBUMIN 1.8* 1.9*   No results for input(s): LIPASE, AMYLASE in the last 168 hours. No results for input(s): AMMONIA in the last 168 hours.  CBC: Recent Labs  Lab 09/07/18 0730 09/08/18 0553 09/10/18 0623  WBC 11.6* 9.6 11.0*  HGB 7.6* 7.5* 7.7*  HCT 21.7* 21.8* 22.3*  MCV 84.8 85.2 83.8  PLT 346 350 401*    Cardiac  Enzymes: No results for input(s): CKTOTAL, CKMB, CKMBINDEX, TROPONINI in the last 168 hours.  BNP (last 3 results) No results for input(s): BNP in the last 8760 hours.  ProBNP (last 3 results) No results for input(s): PROBNP in the last 8760 hours.  Radiological Exams: No results found.  Assessment/Plan Active Problems:   Acute on chronic respiratory failure with hypoxia (HCC)   Cardiac arrest (HCC)   COPD with chronic bronchitis (HCC)   Aspiration pneumonia due to gastric secretions (HCC)   Anoxic brain injury ([place])   1. Acute on chronic respiratory failure with hypoxia we will continue with T collar trials titrate oxygen as tolerated. 2. Cardiac arrest rhythm is been stable 3. COPD at baseline continue present management 4. Aspiration pneumonia treated follow radiologically 5. Anoxic brain injury grossly unchanged   I have personally seen and evaluated the patient, evaluated laboratory and imaging results, formulated the assessment and plan and placed orders. The Patient requires high complexity decision making for assessment and support.  Case was discussed on Rounds with the Respiratory Therapy Staff  Allyne Gee, MD [facility] Pulmonary Critical Care Medicine Sleep Medicine

## 2018-09-12 DIAGNOSIS — J9621 Acute and chronic respiratory failure with hypoxia: Secondary | ICD-10-CM | POA: Diagnosis not present

## 2018-09-12 DIAGNOSIS — I469 Cardiac arrest, cause unspecified: Secondary | ICD-10-CM | POA: Diagnosis not present

## 2018-09-12 DIAGNOSIS — J690 Pneumonitis due to inhalation of food and vomit: Secondary | ICD-10-CM | POA: Diagnosis not present

## 2018-09-12 DIAGNOSIS — G931 Anoxic brain damage, not elsewhere classified: Secondary | ICD-10-CM | POA: Diagnosis not present

## 2018-09-12 NOTE — Progress Notes (Addendum)
Pulmonary Critical Care Medicine [place]   PULMONARY CRITICAL CARE SERVICE  PROGRESS NOTE  Date of Service: 09/12/2018  Catherine Pacheco  GGY:[id number]  DOB: 02/04/1951   DOA: 07/30/2018  Referring Physician: Merton Border, MD  HPI: Catherine Pacheco is a 67 y.o. female seen for follow up of Acute on Chronic Respiratory Failure.  Patient continues on T-bar 28% with a moderate to large amount of secretions noted.  Patient has desaturations with no distress.  Medications: Reviewed on Rounds  Physical Exam:  Vitals: Pulse 76 respirations 26 BP 117/68 O2 sat 100% temp 97.9  Ventilator Settings T-bar 28%  . General: Comfortable at this time . Eyes: Grossly normal lids, irises & conjunctiva . ENT: grossly tongue is normal . Neck: no obvious mass . Cardiovascular: S1 S2 normal no gallop . Respiratory: No rales or rhonchi noted . Abdomen: soft . Skin: no rash seen on limited exam . Musculoskeletal: not rigid . Psychiatric:unable to assess . Neurologic: no seizure no involuntary movements         Lab Data:   Basic Metabolic Panel: Recent Labs  Lab 09/07/18 0730 09/08/18 0553 09/10/18 0623  NA 135 136 134*  K 3.7 3.6 3.8  CL 99 98 98  CO2 26 27 25   GLUCOSE 131* 112* 107*  BUN 28* 31* 31*  CREATININE 1.04* 1.10* 1.16*  CALCIUM 9.0 9.1 9.0  MG 1.8 2.0 1.8  PHOS  --   --  3.9    ABG: No results for input(s): PHART, PCO2ART, PO2ART, HCO3, O2SAT in the last 168 hours.  Liver Function Tests: Recent Labs  Lab 09/07/18 0730 09/10/18 0623  AST 74*  --   ALT 77*  --   ALKPHOS 214*  --   BILITOT 0.5  --   PROT 6.1*  --   ALBUMIN 1.8* 1.9*   No results for input(s): LIPASE, AMYLASE in the last 168 hours. No results for input(s): AMMONIA in the last 168 hours.  CBC: Recent Labs  Lab 09/07/18 0730 09/08/18 0553 09/10/18 0623  WBC 11.6* 9.6 11.0*  HGB 7.6* 7.5* 7.7*  HCT 21.7* 21.8* 22.3*  MCV 84.8 85.2 83.8  PLT 346 350  401*    Cardiac Enzymes: No results for input(s): CKTOTAL, CKMB, CKMBINDEX, TROPONINI in the last 168 hours.  BNP (last 3 results) No results for input(s): BNP in the last 8760 hours.  ProBNP (last 3 results) No results for input(s): PROBNP in the last 8760 hours.  Radiological Exams: No results found.  Assessment/Plan Active Problems:   Acute on chronic respiratory failure with hypoxia (HCC)   Cardiac arrest (HCC)   COPD with chronic bronchitis (HCC)   Aspiration pneumonia due to gastric secretions (HCC)   Anoxic brain injury ([place])   1. Acute on chronic respiratory failure with hypoxia we will continue with T collar trials titrate oxygen as tolerated. 2. Cardiac arrest rhythm is been stable 3. COPD at baseline continue present management 4. Aspiration pneumonia treated follow radiologically 5. Anoxic brain injury grossly unchanged   I have personally seen and evaluated the patient, evaluated laboratory and imaging results, formulated the assessment and plan and placed orders. The Patient requires high complexity decision making for assessment and support.  Case was discussed on Rounds with the Respiratory Therapy Staff  Allyne Gee, MD [facility] Pulmonary Critical Care Medicine Sleep Medicine

## 2018-09-13 DIAGNOSIS — I469 Cardiac arrest, cause unspecified: Secondary | ICD-10-CM | POA: Diagnosis not present

## 2018-09-13 DIAGNOSIS — G931 Anoxic brain damage, not elsewhere classified: Secondary | ICD-10-CM | POA: Diagnosis not present

## 2018-09-13 DIAGNOSIS — J9621 Acute and chronic respiratory failure with hypoxia: Secondary | ICD-10-CM | POA: Diagnosis not present

## 2018-09-13 DIAGNOSIS — J690 Pneumonitis due to inhalation of food and vomit: Secondary | ICD-10-CM | POA: Diagnosis not present

## 2018-09-13 NOTE — Progress Notes (Addendum)
Pulmonary Critical Care Medicine [place]   PULMONARY CRITICAL CARE SERVICE  PROGRESS NOTE  Date of Service: 09/13/2018  Tinsley Everman  XVQ:[id number]  DOB: 1950/05/24   DOA: 07/30/2018  Referring Physician: Merton Border, MD  HPI: Catherine Pacheco is a 68 y.o. female seen for follow up of Acute on Chronic Respiratory Failure.  Patient continues on aerosol trach collar 28% O2 satting well with no fever or distress.  Medications: Reviewed on Rounds  Physical Exam:  Vitals: Pulse 68 respirations 24 BP 117/64 O2 sat 100% temp 99.3  Ventilator Settings aerosol trach collar 28%  . General: Comfortable at this time . Eyes: Grossly normal lids, irises & conjunctiva . ENT: grossly tongue is normal . Neck: no obvious mass . Cardiovascular: S1 S2 normal no gallop . Respiratory: No rales or rhonchi noted . Abdomen: soft . Skin: no rash seen on limited exam . Musculoskeletal: not rigid . Psychiatric:unable to assess . Neurologic: no seizure no involuntary movements         Lab Data:   Basic Metabolic Panel: Recent Labs  Lab 09/07/18 0730 09/08/18 0553 09/10/18 0623  NA 135 136 134*  K 3.7 3.6 3.8  CL 99 98 98  CO2 26 27 25   GLUCOSE 131* 112* 107*  BUN 28* 31* 31*  CREATININE 1.04* 1.10* 1.16*  CALCIUM 9.0 9.1 9.0  MG 1.8 2.0 1.8  PHOS  --   --  3.9    ABG: No results for input(s): PHART, PCO2ART, PO2ART, HCO3, O2SAT in the last 168 hours.  Liver Function Tests: Recent Labs  Lab 09/07/18 0730 09/10/18 0623  AST 74*  --   ALT 77*  --   ALKPHOS 214*  --   BILITOT 0.5  --   PROT 6.1*  --   ALBUMIN 1.8* 1.9*   No results for input(s): LIPASE, AMYLASE in the last 168 hours. No results for input(s): AMMONIA in the last 168 hours.  CBC: Recent Labs  Lab 09/07/18 0730 09/08/18 0553 09/10/18 0623  WBC 11.6* 9.6 11.0*  HGB 7.6* 7.5* 7.7*  HCT 21.7* 21.8* 22.3*  MCV 84.8 85.2 83.8  PLT 346 350 401*    Cardiac  Enzymes: No results for input(s): CKTOTAL, CKMB, CKMBINDEX, TROPONINI in the last 168 hours.  BNP (last 3 results) No results for input(s): BNP in the last 8760 hours.  ProBNP (last 3 results) No results for input(s): PROBNP in the last 8760 hours.  Radiological Exams: No results found.  Assessment/Plan Active Problems:   Acute on chronic respiratory failure with hypoxia (HCC)   Cardiac arrest (HCC)   COPD with chronic bronchitis (HCC)   Aspiration pneumonia due to gastric secretions (HCC)   Anoxic brain injury ([place])   1. Acute on chronic respiratory failure with hypoxia we will continue with T collar trials titrate oxygen as tolerated. 2. Cardiac arrest rhythm is been stable 3. COPD at baseline continue present management 4. Aspiration pneumonia treated follow radiologically 5. Anoxic brain injury grossly unchanged   I have personally seen and evaluated the patient, evaluated laboratory and imaging results, formulated the assessment and plan and placed orders. The Patient requires high complexity decision making for assessment and support.  Case was discussed on Rounds with the Respiratory Therapy Staff  Allyne Gee, MD [facility] Pulmonary Critical Care Medicine Sleep Medicine

## 2018-09-14 DIAGNOSIS — J9621 Acute and chronic respiratory failure with hypoxia: Secondary | ICD-10-CM | POA: Diagnosis not present

## 2018-09-14 DIAGNOSIS — J690 Pneumonitis due to inhalation of food and vomit: Secondary | ICD-10-CM | POA: Diagnosis not present

## 2018-09-14 DIAGNOSIS — G931 Anoxic brain damage, not elsewhere classified: Secondary | ICD-10-CM | POA: Diagnosis not present

## 2018-09-14 DIAGNOSIS — I469 Cardiac arrest, cause unspecified: Secondary | ICD-10-CM | POA: Diagnosis not present

## 2018-09-14 LAB — CBC
HCT: 22.2 % — ABNORMAL LOW (ref 36.0–46.0)
Hemoglobin: 7.6 g/dL — ABNORMAL LOW (ref 12.0–15.0)
MCH: 28.7 pg (ref 26.0–34.0)
MCHC: 34.2 g/dL (ref 30.0–36.0)
MCV: 83.8 fL (ref 80.0–100.0)
Platelets: 409 10*3/uL — ABNORMAL HIGH (ref 150–400)
RBC: 2.65 MIL/uL — ABNORMAL LOW (ref 3.87–5.11)
RDW: 15.1 % (ref 11.5–15.5)
WBC: 10.4 10*3/uL (ref 4.0–10.5)
nRBC: 0 % (ref 0.0–0.2)

## 2018-09-14 LAB — RENAL FUNCTION PANEL
Albumin: 2 g/dL — ABNORMAL LOW (ref 3.5–5.0)
Anion gap: 11 (ref 5–15)
BUN: 40 mg/dL — ABNORMAL HIGH (ref 8–23)
CO2: 26 mmol/L (ref 22–32)
Calcium: 9.1 mg/dL (ref 8.9–10.3)
Chloride: 98 mmol/L (ref 98–111)
Creatinine, Ser: 1.19 mg/dL — ABNORMAL HIGH (ref 0.44–1.00)
GFR calc Af Amer: 55 mL/min — ABNORMAL LOW (ref >60)
GFR calc non Af Amer: 48 mL/min — ABNORMAL LOW (ref >60)
Glucose, Bld: 118 mg/dL — ABNORMAL HIGH (ref 70–99)
Phosphorus: 4.1 mg/dL (ref 2.5–4.6)
Potassium: 3.5 mmol/L (ref 3.5–5.1)
Sodium: 135 mmol/L (ref 135–145)

## 2018-09-14 LAB — MAGNESIUM: Magnesium: 1.9 mg/dL (ref 1.7–2.4)

## 2018-09-14 NOTE — Progress Notes (Signed)
Pulmonary Critical Care Medicine [place]   PULMONARY CRITICAL CARE SERVICE  PROGRESS NOTE  Date of Service: 09/14/2018  Catherine Pacheco  [id number]  DOB: 1950-04-16   DOA: 07/30/2018  Referring Physician: Merton Border, MD  HPI: Catherine Pacheco is a 68 y.o. female seen for follow up of Acute on Chronic Respiratory Failure.  Patient is on T collar right now comfortable without distress has been on 28% FiO2  Medications: Reviewed on Rounds  Physical Exam:  Vitals: Temperature 97.8 pulse 63 respiratory 24 blood pressure 113/64 saturations 100%  Ventilator Settings off ventilator on T collar right now  . General: Comfortable at this time . Eyes: Grossly normal lids, irises & conjunctiva . ENT: grossly tongue is normal . Neck: no obvious mass . Cardiovascular: S1 S2 normal no gallop . Respiratory: No rhonchi no rales are noted . Abdomen: soft . Skin: no rash seen on limited exam . Musculoskeletal: not rigid . Psychiatric:unable to assess . Neurologic: no seizure no involuntary movements         Lab Data:   Basic Metabolic Panel: Recent Labs  Lab 09/08/18 0553 09/10/18 0623 09/14/18 0652  NA 136 134* 135  K 3.6 3.8 3.5  CL 98 98 98  CO2 27 25 26   GLUCOSE 112* 107* 118*  BUN 31* 31* 40*  CREATININE 1.10* 1.16* 1.19*  CALCIUM 9.1 9.0 9.1  MG 2.0 1.8 1.9  PHOS  --  3.9 4.1    ABG: No results for input(s): PHART, PCO2ART, PO2ART, HCO3, O2SAT in the last 168 hours.  Liver Function Tests: Recent Labs  Lab 09/10/18 0623 09/14/18 0652  ALBUMIN 1.9* 2.0*   No results for input(s): LIPASE, AMYLASE in the last 168 hours. No results for input(s): AMMONIA in the last 168 hours.  CBC: Recent Labs  Lab 09/08/18 0553 09/10/18 0623 09/14/18 0652  WBC 9.6 11.0* 10.4  HGB 7.5* 7.7* 7.6*  HCT 21.8* 22.3* 22.2*  MCV 85.2 83.8 83.8  PLT 350 401* 409*    Cardiac Enzymes: No results for input(s): CKTOTAL, CKMB, CKMBINDEX,  TROPONINI in the last 168 hours.  BNP (last 3 results) No results for input(s): BNP in the last 8760 hours.  ProBNP (last 3 results) No results for input(s): PROBNP in the last 8760 hours.  Radiological Exams: No results found.  Assessment/Plan Active Problems:   Acute on chronic respiratory failure with hypoxia (HCC)   Cardiac arrest (HCC)   COPD with chronic bronchitis (HCC)   Aspiration pneumonia due to gastric secretions (HCC)   Anoxic brain injury ([place])   1. Acute on chronic respiratory failure with hypoxia we will continue with T collar trials titrate oxygen continue pulmonary toilet. 2. Cardiac arrest rhythm is stable at this time we will continue to follow 3. COPD advanced disease we will follow 4. Aspiration pneumonia treated follow radiologically 5. Anoxic brain injury grossly unchanged   I have personally seen and evaluated the patient, evaluated laboratory and imaging results, formulated the assessment and plan and placed orders. The Patient requires high complexity decision making for assessment and support.  Case was discussed on Rounds with the Respiratory Therapy Staff  Allyne Gee, MD [facility] Pulmonary Critical Care Medicine Sleep Medicine

## 2018-09-15 DIAGNOSIS — J690 Pneumonitis due to inhalation of food and vomit: Secondary | ICD-10-CM | POA: Diagnosis not present

## 2018-09-15 DIAGNOSIS — I469 Cardiac arrest, cause unspecified: Secondary | ICD-10-CM | POA: Diagnosis not present

## 2018-09-15 DIAGNOSIS — G931 Anoxic brain damage, not elsewhere classified: Secondary | ICD-10-CM | POA: Diagnosis not present

## 2018-09-15 DIAGNOSIS — J9621 Acute and chronic respiratory failure with hypoxia: Secondary | ICD-10-CM | POA: Diagnosis not present

## 2018-09-15 NOTE — Progress Notes (Signed)
Pulmonary Critical Care Medicine [place]   PULMONARY CRITICAL CARE SERVICE  PROGRESS NOTE  Date of Service: 09/15/2018  Catherine Pacheco  [id number]:[id number]  DOB: 1950/08/29   DOA: 07/30/2018  Referring Physician: Merton Border, MD  HPI: Catherine Pacheco is a 68 y.o. female seen for follow up of Acute on Chronic Respiratory Failure.  She is on T collar right now comfortable without distress remains on 28% FiO2  Medications: Reviewed on Rounds  Physical Exam:  Vitals: Temperature 97.5 pulse 64 respiratory 15 blood pressure 127/73 saturations 100%  Ventilator Settings off ventilator on T collar currently  . General: Comfortable at this time . Eyes: Grossly normal lids, irises & conjunctiva . ENT: grossly tongue is normal . Neck: no obvious mass . Cardiovascular: S1 S2 normal no gallop . Respiratory: No rhonchi no rales are noted . Abdomen: soft . Skin: no rash seen on limited exam . Musculoskeletal: not rigid . Psychiatric:unable to assess . Neurologic: no seizure no involuntary movements         Lab Data:   Basic Metabolic Panel: Recent Labs  Lab 09/10/18 0623 09/14/18 0652  NA 134* 135  K 3.8 3.5  CL 98 98  CO2 25 26  GLUCOSE 107* 118*  BUN 31* 40*  CREATININE 1.16* 1.19*  CALCIUM 9.0 9.1  MG 1.8 1.9  PHOS 3.9 4.1    ABG: No results for input(s): PHART, PCO2ART, PO2ART, HCO3, O2SAT in the last 168 hours.  Liver Function Tests: Recent Labs  Lab 09/10/18 0623 09/14/18 0652  ALBUMIN 1.9* 2.0*   No results for input(s): LIPASE, AMYLASE in the last 168 hours. No results for input(s): AMMONIA in the last 168 hours.  CBC: Recent Labs  Lab 09/10/18 0623 09/14/18 0652  WBC 11.0* 10.4  HGB 7.7* 7.6*  HCT 22.3* 22.2*  MCV 83.8 83.8  PLT 401* 409*    Cardiac Enzymes: No results for input(s): CKTOTAL, CKMB, CKMBINDEX, TROPONINI in the last 168 hours.  BNP (last 3 results) No results for input(s): BNP in the last 8760  hours.  ProBNP (last 3 results) No results for input(s): PROBNP in the last 8760 hours.  Radiological Exams: No results found.  Assessment/Plan Active Problems:   Acute on chronic respiratory failure with hypoxia (HCC)   Cardiac arrest (HCC)   COPD with chronic bronchitis (HCC)   Aspiration pneumonia due to gastric secretions (HCC)   Anoxic brain injury ([place])   1. Acute on chronic respiratory failure with hypoxia we will continue with T collar trials titrate oxygen continue pulmonary toilet. 2. Cardiac arrest rhythm is stable we will continue with supportive care 3. COPD at baseline 4. Aspiration pneumonia treated we will continue to follow 5. Anoxic brain injury grossly unchanged   I have personally seen and evaluated the patient, evaluated laboratory and imaging results, formulated the assessment and plan and placed orders. The Patient requires high complexity decision making for assessment and support.  Case was discussed on Rounds with the Respiratory Therapy Staff  Allyne Gee, MD [facility] Pulmonary Critical Care Medicine Sleep Medicine

## 2018-09-16 ENCOUNTER — Other Ambulatory Visit (HOSPITAL_COMMUNITY): Payer: Medicare Other

## 2018-09-16 DIAGNOSIS — I469 Cardiac arrest, cause unspecified: Secondary | ICD-10-CM | POA: Diagnosis not present

## 2018-09-16 DIAGNOSIS — G931 Anoxic brain damage, not elsewhere classified: Secondary | ICD-10-CM | POA: Diagnosis not present

## 2018-09-16 DIAGNOSIS — J9621 Acute and chronic respiratory failure with hypoxia: Secondary | ICD-10-CM | POA: Diagnosis not present

## 2018-09-16 DIAGNOSIS — J690 Pneumonitis due to inhalation of food and vomit: Secondary | ICD-10-CM | POA: Diagnosis not present

## 2018-09-16 MED ORDER — IOHEXOL 300 MG/ML  SOLN
75.0000 mL | Freq: Once | INTRAMUSCULAR | Status: AC | PRN
  Administered 2018-09-16: 75 mL via INTRAVENOUS

## 2018-09-16 NOTE — Progress Notes (Addendum)
Pulmonary Critical Care Medicine [place]   PULMONARY CRITICAL CARE SERVICE  PROGRESS NOTE  Date of Service: 09/16/2018  Catherine Pacheco  UJW:[id number]  DOB: 1950-06-14   DOA: 07/30/2018  Referring Physician: Merton Border, MD  HPI: Catherine Pacheco is a 68 y.o. female seen for follow up of Acute on Chronic Respiratory Failure.  Patient continues on T-bar 20% FiO2 satting well with no distress.  Medications: Reviewed on Rounds  Physical Exam:  Vitals: 67 respirations 30 BP 152/78 O2 sat 100% temp 98.6  Ventilator Settings T-bar 28%  . General: Comfortable at this time . Eyes: Grossly normal lids, irises & conjunctiva . ENT: grossly tongue is normal . Neck: no obvious mass . Cardiovascular: S1 S2 normal no gallop . Respiratory: No rales or rhonchi noted . Abdomen: soft . Skin: no rash seen on limited exam . Musculoskeletal: not rigid . Psychiatric:unable to assess . Neurologic: no seizure no involuntary movements         Lab Data:   Basic Metabolic Panel: Recent Labs  Lab 09/10/18 0623 09/14/18 0652  NA 134* 135  K 3.8 3.5  CL 98 98  CO2 25 26  GLUCOSE 107* 118*  BUN 31* 40*  CREATININE 1.16* 1.19*  CALCIUM 9.0 9.1  MG 1.8 1.9  PHOS 3.9 4.1    ABG: No results for input(s): PHART, PCO2ART, PO2ART, HCO3, O2SAT in the last 168 hours.  Liver Function Tests: Recent Labs  Lab 09/10/18 0623 09/14/18 0652  ALBUMIN 1.9* 2.0*   No results for input(s): LIPASE, AMYLASE in the last 168 hours. No results for input(s): AMMONIA in the last 168 hours.  CBC: Recent Labs  Lab 09/10/18 0623 09/14/18 0652  WBC 11.0* 10.4  HGB 7.7* 7.6*  HCT 22.3* 22.2*  MCV 83.8 83.8  PLT 401* 409*    Cardiac Enzymes: No results for input(s): CKTOTAL, CKMB, CKMBINDEX, TROPONINI in the last 168 hours.  BNP (last 3 results) No results for input(s): BNP in the last 8760 hours.  ProBNP (last 3 results) No results for input(s): PROBNP in  the last 8760 hours.  Radiological Exams: No results found.  Assessment/Plan Active Problems:   Acute on chronic respiratory failure with hypoxia (HCC)   Cardiac arrest (HCC)   COPD with chronic bronchitis (HCC)   Aspiration pneumonia due to gastric secretions (HCC)   Anoxic brain injury ([place])   1. Acute on chronic respiratory failure with hypoxia we will continue with T collar trials titrate oxygen continue pulmonary toilet. 2. Cardiac arrest rhythm is stable we will continue with supportive care 3. COPD at baseline 4. Aspiration pneumonia treated we will continue to follow 5. Anoxic brain injury grossly unchanged   I have personally seen and evaluated the patient, evaluated laboratory and imaging results, formulated the assessment and plan and placed orders. The Patient requires high complexity decision making for assessment and support.  Case was discussed on Rounds with the Respiratory Therapy Staff  Allyne Gee, MD [facility] Pulmonary Critical Care Medicine Sleep Medicine

## 2018-09-17 DIAGNOSIS — I469 Cardiac arrest, cause unspecified: Secondary | ICD-10-CM | POA: Diagnosis not present

## 2018-09-17 DIAGNOSIS — G931 Anoxic brain damage, not elsewhere classified: Secondary | ICD-10-CM | POA: Diagnosis not present

## 2018-09-17 DIAGNOSIS — J9621 Acute and chronic respiratory failure with hypoxia: Secondary | ICD-10-CM | POA: Diagnosis not present

## 2018-09-17 DIAGNOSIS — J690 Pneumonitis due to inhalation of food and vomit: Secondary | ICD-10-CM | POA: Diagnosis not present

## 2018-09-17 LAB — CBC
HCT: 24.4 % — ABNORMAL LOW (ref 36.0–46.0)
Hemoglobin: 8.5 g/dL — ABNORMAL LOW (ref 12.0–15.0)
MCH: 29 pg (ref 26.0–34.0)
MCHC: 34.8 g/dL (ref 30.0–36.0)
MCV: 83.3 fL (ref 80.0–100.0)
Platelets: 422 10*3/uL — ABNORMAL HIGH (ref 150–400)
RBC: 2.93 MIL/uL — ABNORMAL LOW (ref 3.87–5.11)
RDW: 15.2 % (ref 11.5–15.5)
WBC: 12.6 10*3/uL — ABNORMAL HIGH (ref 4.0–10.5)
nRBC: 0 % (ref 0.0–0.2)

## 2018-09-17 NOTE — Progress Notes (Signed)
[facility]   PULMONARY CRITICAL CARE SERVICE  PROGRESS NOTE  Date of Service: 09/17/2018  Catherine BuggyVictoria McCorkle Bushard  UJW:[id number]  DOB: 05-Sep-1950   DOA: 07/30/2018  Referring Physician: Carron CurieAli Hijazi, MD  HPI: Catherine Pacheco is a 68 y.o. female seen for follow up of Acute on Chronic Respiratory Failure.  Patient right now is on T collar is on room air comfortable without distress  Medications: Reviewed on Rounds  Physical Exam:  Vitals: Temperature 98.2 pulse 75 respiratory 19 blood pressure 144/72 saturations 100%  Ventilator Settings on T collar right now room air  . General: Comfortable at this time . Eyes: Grossly normal lids, irises & conjunctiva . ENT: grossly tongue is normal . Neck: no obvious mass . Cardiovascular: S1 S2 normal no gallop . Respiratory: No rhonchi no rales are noted at this time . Abdomen: soft . Skin: no rash seen on limited exam . Musculoskeletal: not rigid . Psychiatric:unable to assess . Neurologic: no seizure no involuntary movements         Lab Data:   Basic Metabolic Panel: Recent Labs  Lab 09/14/18 0652  NA 135  K 3.5  CL 98  CO2 26  GLUCOSE 118*  BUN 40*  CREATININE 1.19*  CALCIUM 9.1  MG 1.9  PHOS 4.1    ABG: No results for input(s): PHART, PCO2ART, PO2ART, HCO3, O2SAT in the last 168 hours.  Liver Function Tests: Recent Labs  Lab 09/14/18 0652  ALBUMIN 2.0*   No results for input(s): LIPASE, AMYLASE in the last 168 hours. No results for input(s): AMMONIA in the last 168 hours.  CBC: Recent Labs  Lab 09/14/18 0652 09/17/18 0634  WBC 10.4 12.6*  HGB 7.6* 8.5*  HCT 22.2* 24.4*  MCV 83.8 83.3  PLT 409* 422*    Cardiac Enzymes: No results for input(s): CKTOTAL, CKMB, CKMBINDEX, TROPONINI in the last 168 hours.  BNP (last 3 results) No results for input(s): BNP in the last 8760 hours.  ProBNP (last 3 results) No results for input(s): PROBNP in  the last 8760 hours.  Radiological Exams: Ct Maxillofacial W Contrast  Result Date: 09/16/2018 CLINICAL DATA:  68 y/o  F; acute lymphadenitis. EXAM: CT MAXILLOFACIAL WITH CONTRAST TECHNIQUE: Multidetector CT imaging of the maxillofacial structures was performed with intravenous contrast. Multiplanar CT image reconstructions were also generated. CONTRAST:  75mL OMNIPAQUE IOHEXOL 300 MG/ML  SOLN COMPARISON:  08/24/2018 CT head. FINDINGS: Osseous: No acute fracture. Chronic sequelae of advanced dental disease. Marked underbite. Orbits: Negative. No traumatic or inflammatory finding. Sinuses: Extensive mucosal thickening of maxillary sinuses. Partial opacification of right sphenoid sinus. Bilateral mastoid opacification. Soft tissues: Retropharyngeal course of internal carotid arteries. Left parotid and left-greater-than-right submandibular lymphadenopathy. No lymph node necrosis. Edema within subcutaneous soft tissues overlying the right mandible. No discrete fluid collection identified. Posterior prolapse of the tongue effacing the oropharynx. Tracheostomy tube extending below cricoid cartilage, incompletely visualized. 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 Extensive motion artifact. IMPRESSION: 1. Motion degraded study. 2. Left parotid and left-greater-than-right submandibular lymphadenopathy, likely reactive. No lymph node necrosis. 3. Edema within subcutaneous soft tissues overlying the right mandible of uncertain significance, question dental infection. No discrete fluid collection identified. 4. Paranasal sinus disease and mastoid opacification. 5. 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 Electronically Signed   By: Mitzi HansenLance  Furusawa-Stratton M.D.   On: 09/16/2018 23:24    Assessment/Plan Active Problems:  Acute on chronic respiratory failure with hypoxia (HCC)   Cardiac arrest (HCC)   COPD with chronic bronchitis (HCC)   Aspiration  pneumonia due to gastric secretions ([place])   Anoxic brain injury ([place])   1. Acute on chronic respiratory failure hypoxia we will continue with T collar trials patient secretions are fair to moderate continue pulmonary toilet 2. Cardiac arrest rhythm is stable we will continue with supportive care 3. COPD advanced disease supportive care 4. Aspiration pneumonia treated 5. Anoxic brain injury grossly unchanged   I have personally seen and evaluated the patient, evaluated laboratory and imaging results, formulated the assessment and plan and placed orders. The Patient requires high complexity decision making for assessment and support.  Case was discussed on Rounds with the Respiratory Therapy Staff  Allyne Gee, MD [facility] Pulmonary Critical Care Medicine Sleep Medicine

## 2019-01-03 DEATH — deceased

## 2020-04-19 IMAGING — DX ABDOMEN - 1 VIEW
1 series · 1 of 1 positions shown · non-contrast
Comparison: None.

CLINICAL DATA: NG tube placed

EXAM:
ABDOMEN - 1 VIEW

[abdomen kub]
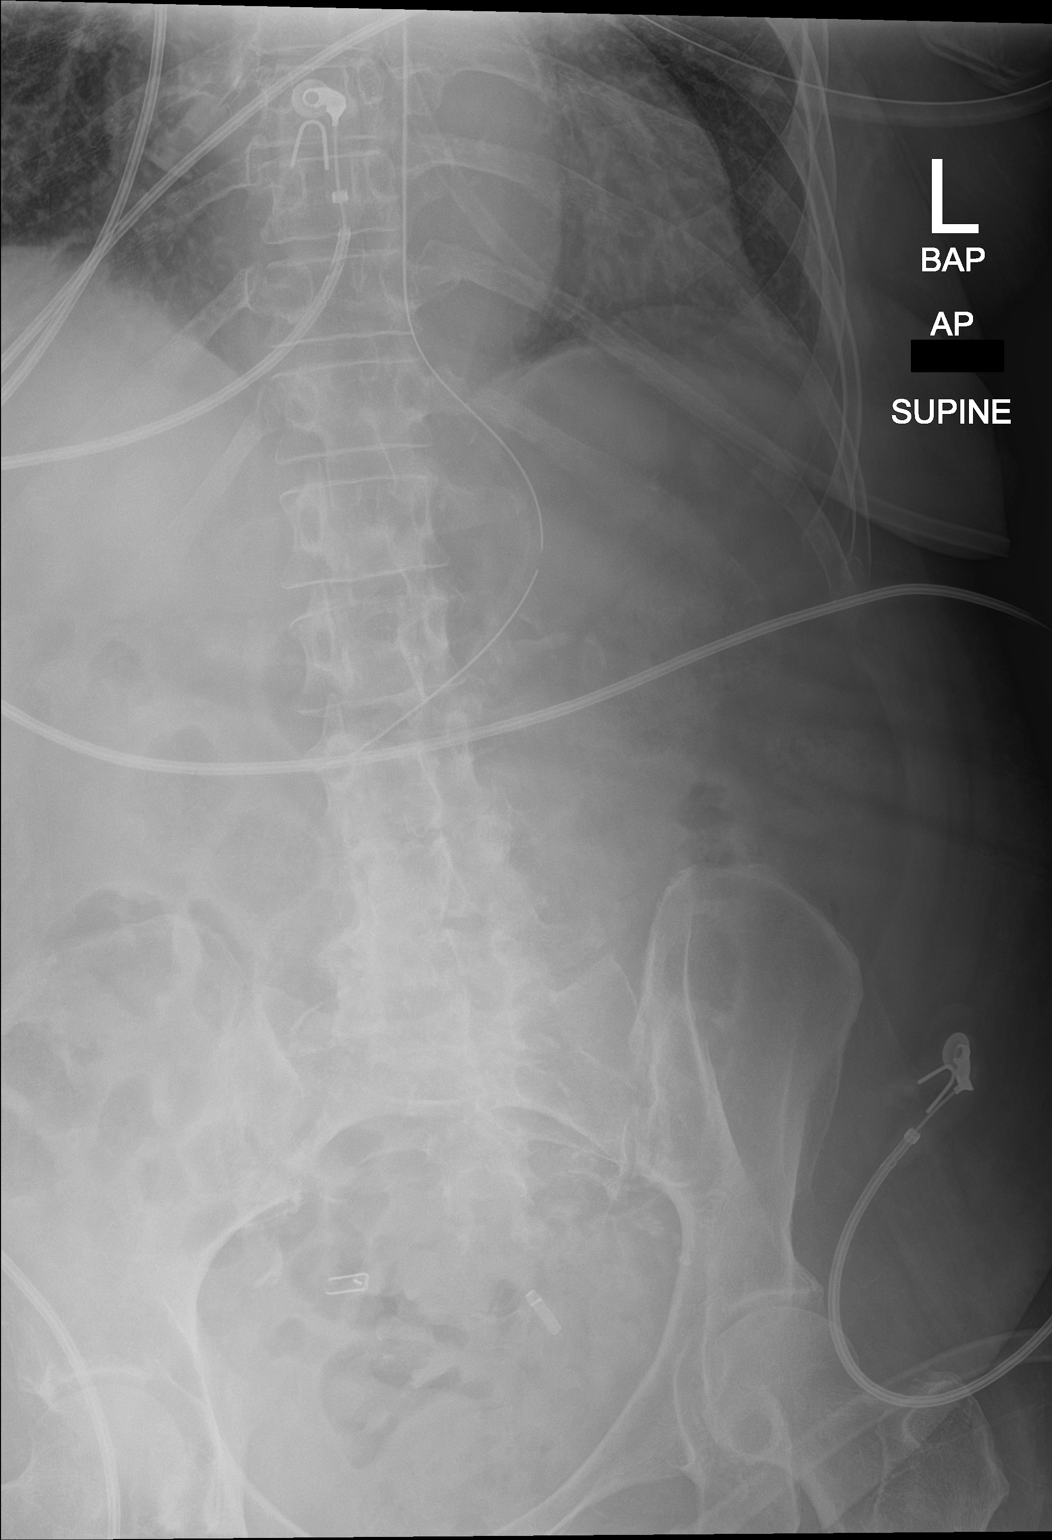

[1 of 1 positions shown; findings below may reference images not displayed]

FINDINGS: Nasogastric tube with the tip projecting over the stomach. There is
no bowel dilatation to suggest obstruction. There is no evidence of
pneumoperitoneum, portal venous gas or pneumatosis.

There are no pathologic calcifications along the expected course of
the ureters.

The osseous structures are unremarkable.
IMPRESSION: Nasogastric tube with the tip projecting over the stomach.

## 2020-04-19 IMAGING — CT CT ABDOMEN WITHOUT CONTRAST
2 of 4 series · 15 of 46 positions shown, 17 images · non-contrast
Comparison: None.

CLINICAL DATA: Preop planning for gastrostomy tube

EXAM:
CT ABDOMEN WITHOUT CONTRAST
TECHNIQUE: Multidetector CT imaging of the abdomen was performed following the
standard protocol without IV contrast.

[Series 3: a/p w/o 5mm · axial · non-contrast · 0.79mm/px · z∈[+1018,+1248]mm · 12 of 52 slices shown, 14 images]
[im 3/52  soft-tissue]
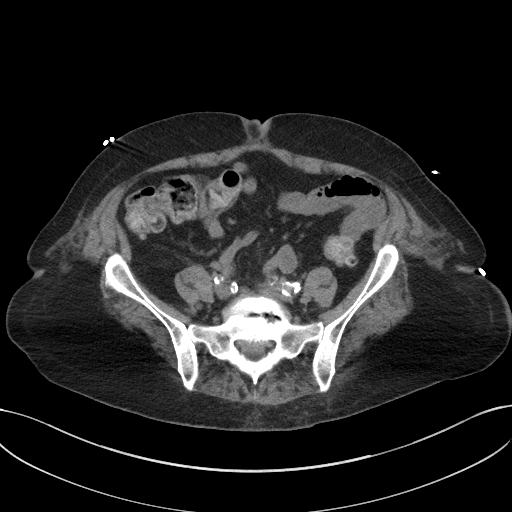
[im 3/52  bone]
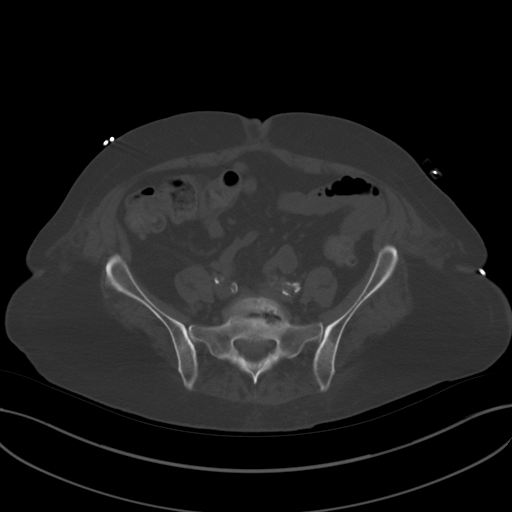
[im 7/52  soft-tissue]
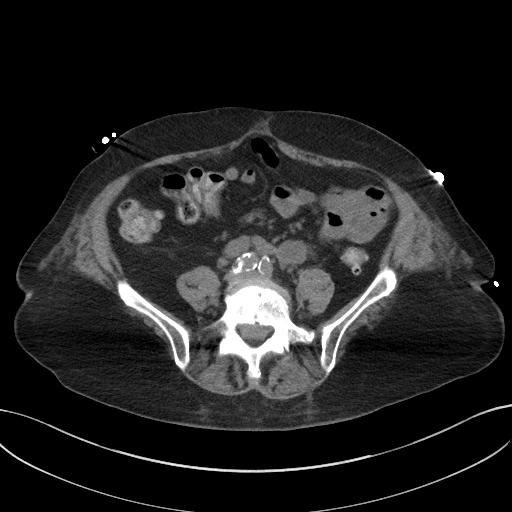
[im 12/52  soft-tissue]
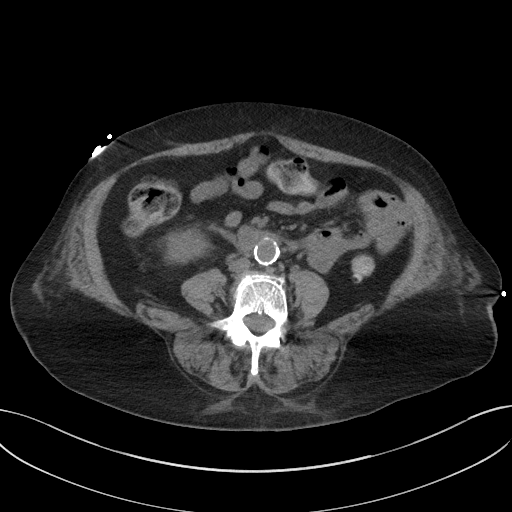
[im 17/52  soft-tissue]
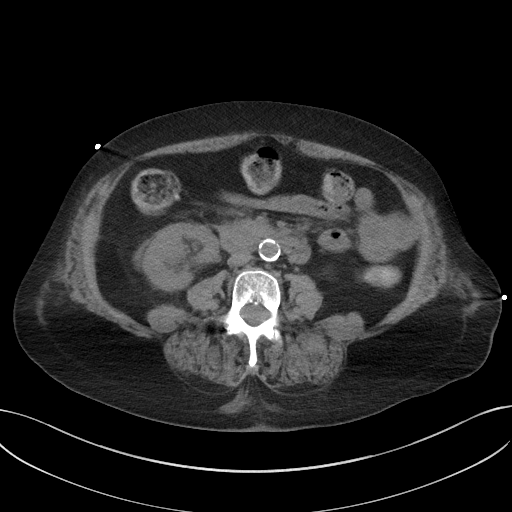
[im 19/52  soft-tissue]
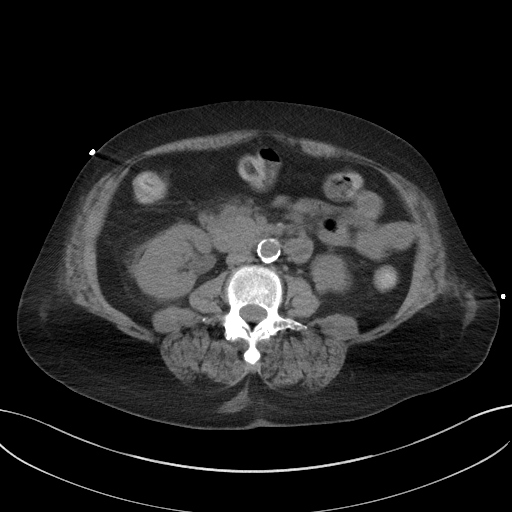
[im 24/52  soft-tissue]
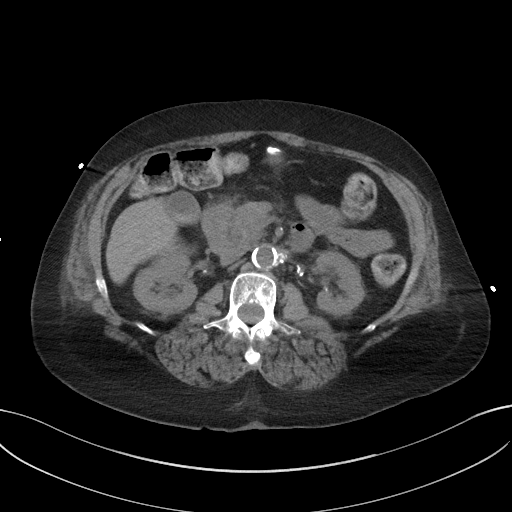
[im 28/52  soft-tissue]
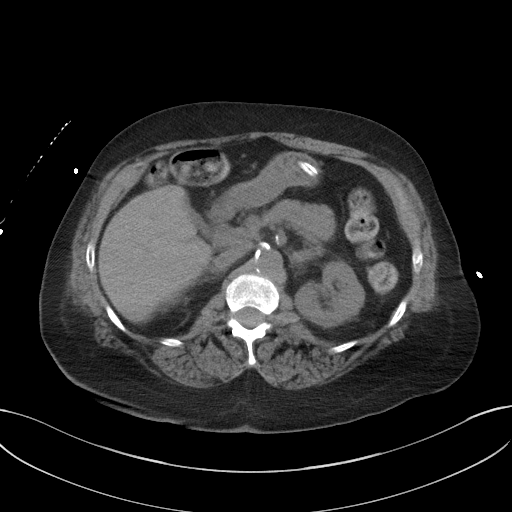
[im 33/52  soft-tissue]
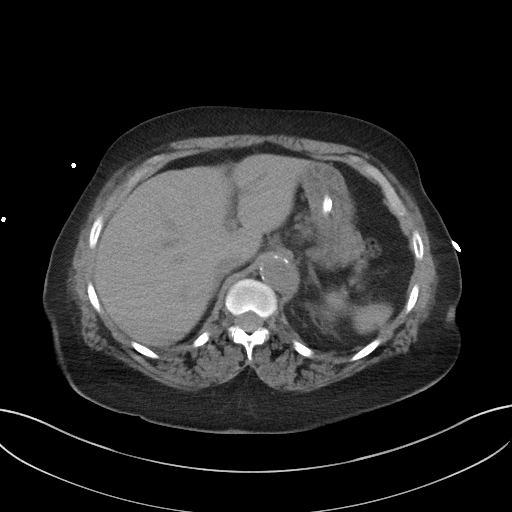
[im 35/52  soft-tissue]
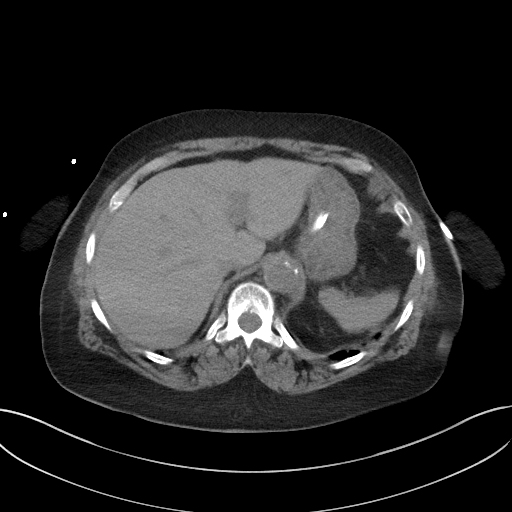
[im 35/52  bone]
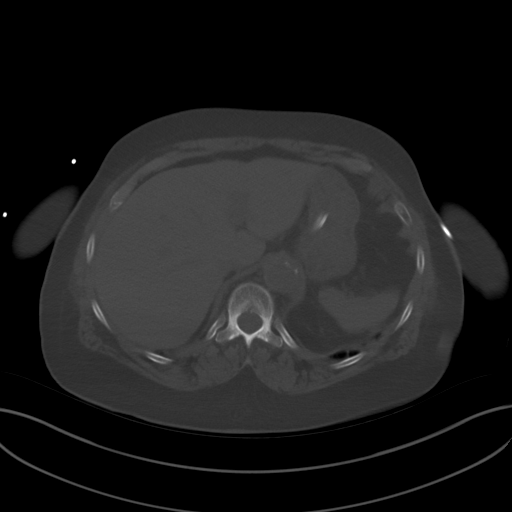
[im 40/52  soft-tissue]
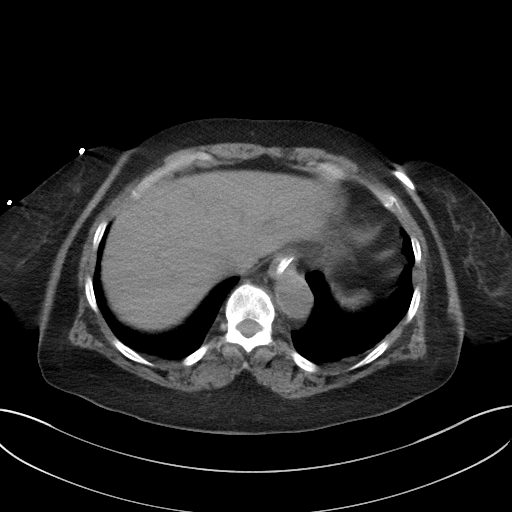
[im 45/52  soft-tissue]
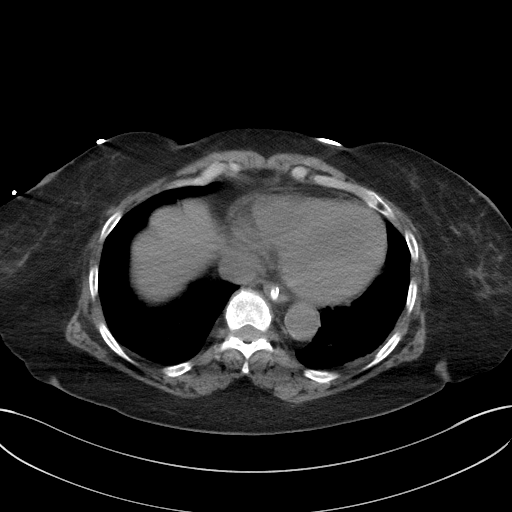
[im 49/52  soft-tissue]
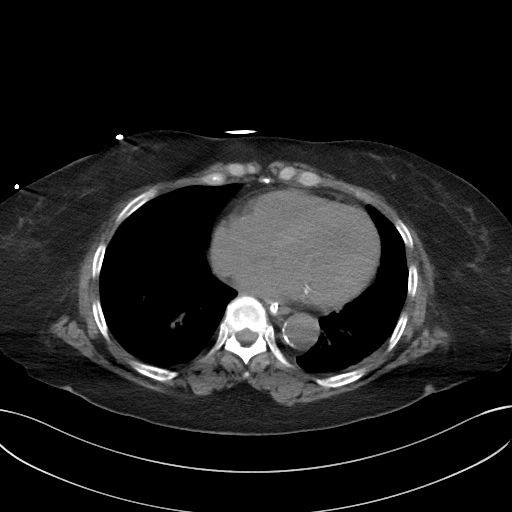

[Series 6: a/p w/o cor · coronal · non-contrast · 0.50mm/px · 3 of 151 slices shown]
[im 51/151  soft-tissue]
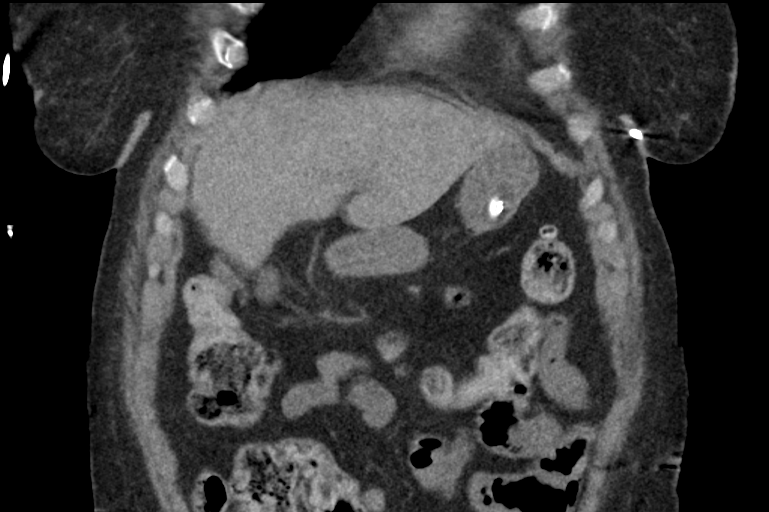
[im 67/151  soft-tissue]
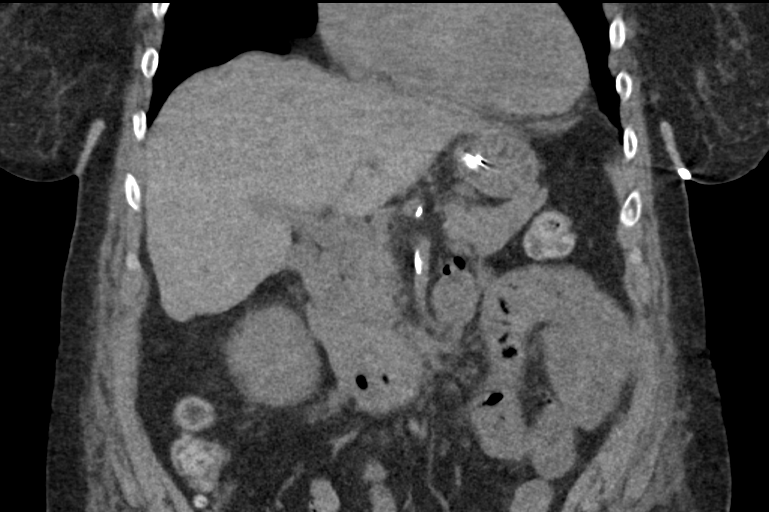
[im 84/151  soft-tissue]
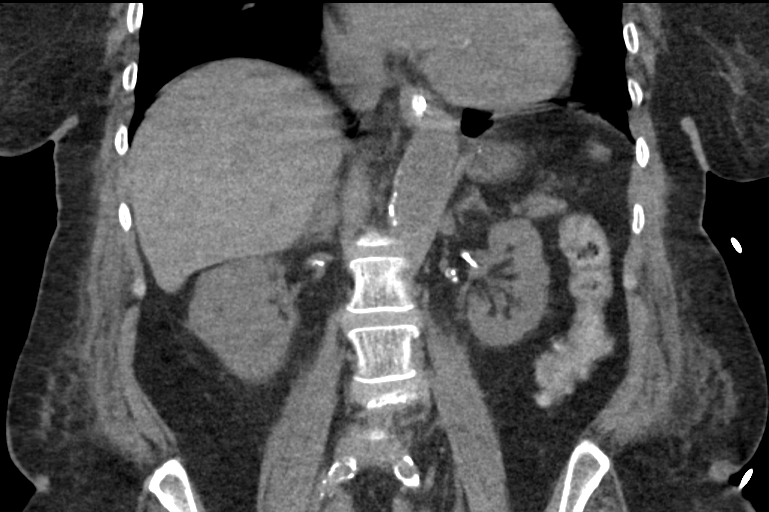

[15 of 46 positions shown; findings below may reference images not displayed]

FINDINGS: Lower chest: Scattered coronary calcifications. No pleural or
pericardial effusion. Subpleural bleb in the inferior right middle
lobe.

Hepatobiliary: Unremarkable liver. Hyperdense material in the
dependent aspect of the nondistended gallbladder fundus may
represent mass, small gallstones, or vicarious excretion of
contrast. No biliary ductal dilatation.

Pancreas: Unremarkable. No pancreatic ductal dilatation or
surrounding inflammatory changes.

Spleen: Normal in size without focal abnormality.

Adrenals/Urinary Tract: Adrenal glands are unremarkable. Renal
arterial calcifications. Kidneys are normal, without renal calculi,
focal lesion, or hydronephrosis.

Stomach/Bowel: Nasogastric tube decompresses the stomach. The does
appear to be appropriate percutaneous window for gastrostomy
placement. Visualized small bowel and colon are nondilated.
Scattered colonic diverticula.

Vascular/Lymphatic: Heavy aortoiliac arterial calcifications without
aneurysm. No abdominal or mesenteric adenopathy localized.

Other: No ascites. No free air.

Musculoskeletal: Small umbilical hernia containing only mesenteric
fat. Prominent Schmorl's node in the superior endplate of L2.
Advanced spondylitic changes L3-S1. No acute fracture or worrisome
bone lesion.
IMPRESSION: 1. Satisfactory percutaneous window for gastrostomy placement.
2. Coronary and aortoiliac arterial calcifications.
3. Colonic diverticulosis.
